# Patient Record
Sex: Female | Born: 1940 | Race: White | Hispanic: No | Smoking: Never smoker
Health system: Southern US, Community
[De-identification: ages and names within clinical notes are randomized; demographics above are authoritative.]

## PROBLEM LIST (undated history)

## (undated) DIAGNOSIS — C8590 Non-Hodgkin lymphoma, unspecified, unspecified site: Secondary | ICD-10-CM

## (undated) DIAGNOSIS — A0472 Enterocolitis due to Clostridium difficile, not specified as recurrent: Secondary | ICD-10-CM

## (undated) DIAGNOSIS — M3500 Sicca syndrome, unspecified: Secondary | ICD-10-CM

## (undated) DIAGNOSIS — F32A Depression, unspecified: Secondary | ICD-10-CM

## (undated) DIAGNOSIS — F329 Major depressive disorder, single episode, unspecified: Secondary | ICD-10-CM

## (undated) DIAGNOSIS — K219 Gastro-esophageal reflux disease without esophagitis: Secondary | ICD-10-CM

## (undated) DIAGNOSIS — M1990 Unspecified osteoarthritis, unspecified site: Secondary | ICD-10-CM

## (undated) DIAGNOSIS — I10 Essential (primary) hypertension: Secondary | ICD-10-CM

## (undated) DIAGNOSIS — N390 Urinary tract infection, site not specified: Secondary | ICD-10-CM

## (undated) HISTORY — PX: REPLACEMENT TOTAL KNEE BILATERAL: SUR1225

## (undated) HISTORY — PX: TOTAL ABDOMINAL HYSTERECTOMY: SHX209

## (undated) HISTORY — PX: FOOT SURGERY: SHX648

## (undated) HISTORY — PX: BREAST REDUCTION SURGERY: SHX8

## (undated) HISTORY — PX: CHOLECYSTECTOMY: SHX55

## (undated) HISTORY — PX: SHOULDER SURGERY: SHX246

## (undated) HISTORY — PX: TONSILLECTOMY: SUR1361

---

## 2018-01-15 DIAGNOSIS — A0472 Enterocolitis due to Clostridium difficile, not specified as recurrent: Secondary | ICD-10-CM

## 2018-01-15 DIAGNOSIS — N390 Urinary tract infection, site not specified: Secondary | ICD-10-CM

## 2018-01-15 HISTORY — DX: Enterocolitis due to Clostridium difficile, not specified as recurrent: A04.72

## 2018-01-15 HISTORY — DX: Urinary tract infection, site not specified: N39.0

## 2018-02-07 ENCOUNTER — Inpatient Hospital Stay (HOSPITAL_COMMUNITY)
Admission: EM | Admit: 2018-02-07 | Discharge: 2018-02-09 | DRG: 689 | Disposition: A | Discharge status: Other Acute Inpatient Hospital | Payer: Medicare Other | Attending: Internal Medicine | Admitting: Internal Medicine

## 2018-02-07 ENCOUNTER — Emergency Department (HOSPITAL_COMMUNITY): Payer: Medicare Other

## 2018-02-07 ENCOUNTER — Other Ambulatory Visit: Payer: Self-pay

## 2018-02-07 ENCOUNTER — Encounter (HOSPITAL_COMMUNITY): Payer: Self-pay | Admitting: Emergency Medicine

## 2018-02-07 DIAGNOSIS — Z882 Allergy status to sulfonamides status: Secondary | ICD-10-CM

## 2018-02-07 DIAGNOSIS — Z791 Long term (current) use of non-steroidal anti-inflammatories (NSAID): Secondary | ICD-10-CM | POA: Diagnosis not present

## 2018-02-07 DIAGNOSIS — Z96653 Presence of artificial knee joint, bilateral: Secondary | ICD-10-CM | POA: Diagnosis present

## 2018-02-07 DIAGNOSIS — D638 Anemia in other chronic diseases classified elsewhere: Secondary | ICD-10-CM | POA: Diagnosis present

## 2018-02-07 DIAGNOSIS — Z91048 Other nonmedicinal substance allergy status: Secondary | ICD-10-CM

## 2018-02-07 DIAGNOSIS — N179 Acute kidney failure, unspecified: Secondary | ICD-10-CM | POA: Diagnosis present

## 2018-02-07 DIAGNOSIS — Z9221 Personal history of antineoplastic chemotherapy: Secondary | ICD-10-CM

## 2018-02-07 DIAGNOSIS — D709 Neutropenia, unspecified: Secondary | ICD-10-CM | POA: Diagnosis present

## 2018-02-07 DIAGNOSIS — Z79899 Other long term (current) drug therapy: Secondary | ICD-10-CM | POA: Diagnosis not present

## 2018-02-07 DIAGNOSIS — Z1612 Extended spectrum beta lactamase (ESBL) resistance: Secondary | ICD-10-CM | POA: Diagnosis present

## 2018-02-07 DIAGNOSIS — B9620 Unspecified Escherichia coli [E. coli] as the cause of diseases classified elsewhere: Secondary | ICD-10-CM | POA: Diagnosis present

## 2018-02-07 DIAGNOSIS — I10 Essential (primary) hypertension: Secondary | ICD-10-CM | POA: Diagnosis present

## 2018-02-07 DIAGNOSIS — W19XXXA Unspecified fall, initial encounter: Secondary | ICD-10-CM | POA: Diagnosis not present

## 2018-02-07 DIAGNOSIS — D72819 Decreased white blood cell count, unspecified: Secondary | ICD-10-CM | POA: Diagnosis not present

## 2018-02-07 DIAGNOSIS — N390 Urinary tract infection, site not specified: Secondary | ICD-10-CM | POA: Diagnosis present

## 2018-02-07 DIAGNOSIS — Z885 Allergy status to narcotic agent status: Secondary | ICD-10-CM | POA: Diagnosis not present

## 2018-02-07 DIAGNOSIS — R739 Hyperglycemia, unspecified: Secondary | ICD-10-CM | POA: Diagnosis present

## 2018-02-07 DIAGNOSIS — R296 Repeated falls: Secondary | ICD-10-CM | POA: Diagnosis present

## 2018-02-07 DIAGNOSIS — Z6820 Body mass index (BMI) 20.0-20.9, adult: Secondary | ICD-10-CM | POA: Diagnosis not present

## 2018-02-07 DIAGNOSIS — E43 Unspecified severe protein-calorie malnutrition: Secondary | ICD-10-CM | POA: Diagnosis present

## 2018-02-07 DIAGNOSIS — A0472 Enterocolitis due to Clostridium difficile, not specified as recurrent: Secondary | ICD-10-CM | POA: Diagnosis present

## 2018-02-07 DIAGNOSIS — W010XXA Fall on same level from slipping, tripping and stumbling without subsequent striking against object, initial encounter: Secondary | ICD-10-CM | POA: Diagnosis present

## 2018-02-07 DIAGNOSIS — R5081 Fever presenting with conditions classified elsewhere: Secondary | ICD-10-CM | POA: Diagnosis not present

## 2018-02-07 DIAGNOSIS — C8330 Diffuse large B-cell lymphoma, unspecified site: Secondary | ICD-10-CM | POA: Diagnosis present

## 2018-02-07 DIAGNOSIS — Z888 Allergy status to other drugs, medicaments and biological substances status: Secondary | ICD-10-CM

## 2018-02-07 DIAGNOSIS — Z880 Allergy status to penicillin: Secondary | ICD-10-CM

## 2018-02-07 DIAGNOSIS — G9341 Metabolic encephalopathy: Secondary | ICD-10-CM | POA: Diagnosis present

## 2018-02-07 DIAGNOSIS — D649 Anemia, unspecified: Secondary | ICD-10-CM | POA: Diagnosis present

## 2018-02-07 DIAGNOSIS — B9629 Other Escherichia coli [E. coli] as the cause of diseases classified elsewhere: Secondary | ICD-10-CM | POA: Diagnosis present

## 2018-02-07 DIAGNOSIS — C8331 Diffuse large B-cell lymphoma, lymph nodes of head, face, and neck: Secondary | ICD-10-CM | POA: Diagnosis not present

## 2018-02-07 DIAGNOSIS — M3500 Sicca syndrome, unspecified: Secondary | ICD-10-CM | POA: Diagnosis present

## 2018-02-07 DIAGNOSIS — K219 Gastro-esophageal reflux disease without esophagitis: Secondary | ICD-10-CM | POA: Diagnosis present

## 2018-02-07 DIAGNOSIS — D703 Neutropenia due to infection: Secondary | ICD-10-CM | POA: Diagnosis not present

## 2018-02-07 HISTORY — DX: Major depressive disorder, single episode, unspecified: F32.9

## 2018-02-07 HISTORY — DX: Non-Hodgkin lymphoma, unspecified, unspecified site: C85.90

## 2018-02-07 HISTORY — DX: Essential (primary) hypertension: I10

## 2018-02-07 HISTORY — DX: Sjogren syndrome, unspecified: M35.00

## 2018-02-07 HISTORY — DX: Depression, unspecified: F32.A

## 2018-02-07 HISTORY — DX: Gastro-esophageal reflux disease without esophagitis: K21.9

## 2018-02-07 HISTORY — DX: Urinary tract infection, site not specified: N39.0

## 2018-02-07 HISTORY — DX: Unspecified osteoarthritis, unspecified site: M19.90

## 2018-02-07 HISTORY — DX: Enterocolitis due to Clostridium difficile, not specified as recurrent: A04.72

## 2018-02-07 LAB — CBC WITH DIFFERENTIAL/PLATELET
Basophils Absolute: 0 10*3/uL (ref 0.0–0.1)
Basophils Relative: 0 %
Eosinophils Absolute: 0 10*3/uL (ref 0.0–0.7)
Eosinophils Relative: 7 %
HCT: 27.6 % — ABNORMAL LOW (ref 36.0–46.0)
Hemoglobin: 8.7 g/dL — ABNORMAL LOW (ref 12.0–15.0)
Lymphocytes Relative: 25 %
Lymphs Abs: 0.2 10*3/uL — ABNORMAL LOW (ref 0.7–4.0)
MCH: 31.2 pg (ref 26.0–34.0)
MCHC: 31.5 g/dL (ref 30.0–36.0)
MCV: 98.9 fL (ref 78.0–100.0)
MONO ABS: 0.3 10*3/uL (ref 0.1–1.0)
Monocytes Relative: 41 %
NEUTROS PCT: 27 %
Neutro Abs: 0.2 10*3/uL — ABNORMAL LOW (ref 1.7–7.7)
Platelets: 230 10*3/uL (ref 150–400)
RBC: 2.79 MIL/uL — AB (ref 3.87–5.11)
RDW: 12.5 % (ref 11.5–15.5)
WBC: 0.7 10*3/uL — AB (ref 4.0–10.5)

## 2018-02-07 LAB — COMPREHENSIVE METABOLIC PANEL
ALT: 39 U/L (ref 14–54)
AST: 52 U/L — AB (ref 15–41)
Albumin: 2 g/dL — ABNORMAL LOW (ref 3.5–5.0)
Alkaline Phosphatase: 41 U/L (ref 38–126)
Anion gap: 10 (ref 5–15)
BUN: 45 mg/dL — AB (ref 6–20)
CHLORIDE: 105 mmol/L (ref 101–111)
CO2: 25 mmol/L (ref 22–32)
CREATININE: 1.79 mg/dL — AB (ref 0.44–1.00)
Calcium: 8.4 mg/dL — ABNORMAL LOW (ref 8.9–10.3)
GFR calc non Af Amer: 26 mL/min — ABNORMAL LOW (ref >60)
GFR, EST AFRICAN AMERICAN: 30 mL/min — AB (ref >60)
Glucose, Bld: 124 mg/dL — ABNORMAL HIGH (ref 65–99)
POTASSIUM: 4 mmol/L (ref 3.5–5.1)
Sodium: 140 mmol/L (ref 135–145)
Total Bilirubin: 0.7 mg/dL (ref 0.3–1.2)
Total Protein: 4.7 g/dL — ABNORMAL LOW (ref 6.5–8.1)

## 2018-02-07 LAB — URINALYSIS, ROUTINE W REFLEX MICROSCOPIC
Bilirubin Urine: NEGATIVE
GLUCOSE, UA: NEGATIVE mg/dL
Ketones, ur: NEGATIVE mg/dL
NITRITE: POSITIVE — AB
PROTEIN: 30 mg/dL — AB
SPECIFIC GRAVITY, URINE: 1.016 (ref 1.005–1.030)
pH: 5 (ref 5.0–8.0)

## 2018-02-07 MED ORDER — GABAPENTIN 400 MG PO CAPS
400.0000 mg | ORAL_CAPSULE | Freq: Every day | ORAL | Status: DC
  Administered 2018-02-07 – 2018-02-08 (×2): 400 mg via ORAL
  Filled 2018-02-07 (×2): qty 1

## 2018-02-07 MED ORDER — METOPROLOL TARTRATE 50 MG PO TABS
50.0000 mg | ORAL_TABLET | Freq: Two times a day (BID) | ORAL | Status: DC
  Administered 2018-02-07 – 2018-02-09 (×4): 50 mg via ORAL
  Filled 2018-02-07 (×4): qty 1

## 2018-02-07 MED ORDER — VANCOMYCIN HCL 125 MG PO CAPS
125.0000 mg | ORAL_CAPSULE | Freq: Three times a day (TID) | ORAL | Status: DC
  Filled 2018-02-07 (×2): qty 1

## 2018-02-07 MED ORDER — LATANOPROST 0.005 % OP SOLN
1.0000 [drp] | Freq: Every day | OPHTHALMIC | Status: DC
  Administered 2018-02-07 – 2018-02-08 (×2): 1 [drp] via OPHTHALMIC
  Filled 2018-02-07: qty 2.5

## 2018-02-07 MED ORDER — SODIUM CHLORIDE 0.9 % IV BOLUS
500.0000 mL | Freq: Once | INTRAVENOUS | Status: AC
  Administered 2018-02-07: 500 mL via INTRAVENOUS

## 2018-02-07 MED ORDER — ACETAMINOPHEN 650 MG RE SUPP: 650.0000 mg | Freq: Four times a day (QID) | RECTAL | Status: DC | PRN

## 2018-02-07 MED ORDER — LACTATED RINGERS IV SOLN
INTRAVENOUS | Status: DC
  Administered 2018-02-07 – 2018-02-09 (×4): via INTRAVENOUS

## 2018-02-07 MED ORDER — ENOXAPARIN SODIUM 30 MG/0.3ML ~~LOC~~ SOLN
30.0000 mg | SUBCUTANEOUS | Status: DC
  Administered 2018-02-07: 30 mg via SUBCUTANEOUS
  Filled 2018-02-07: qty 0.3

## 2018-02-07 MED ORDER — MIRABEGRON ER 50 MG PO TB24
50.0000 mg | ORAL_TABLET | Freq: Every day | ORAL | Status: DC
  Administered 2018-02-07 – 2018-02-08 (×2): 50 mg via ORAL
  Filled 2018-02-07 (×3): qty 1

## 2018-02-07 MED ORDER — TIMOLOL MALEATE 0.5 % OP SOLN
1.0000 [drp] | Freq: Every day | OPHTHALMIC | Status: DC
  Administered 2018-02-08 – 2018-02-09 (×2): 1 [drp] via OPHTHALMIC
  Filled 2018-02-07 (×3): qty 5

## 2018-02-07 MED ORDER — DOCUSATE SODIUM 100 MG PO CAPS
100.0000 mg | ORAL_CAPSULE | Freq: Two times a day (BID) | ORAL | Status: DC
  Administered 2018-02-07 – 2018-02-08 (×2): 100 mg via ORAL
  Filled 2018-02-07 (×4): qty 1

## 2018-02-07 MED ORDER — SIMVASTATIN 20 MG PO TABS
20.0000 mg | ORAL_TABLET | Freq: Every day | ORAL | Status: DC
  Administered 2018-02-07 – 2018-02-08 (×2): 20 mg via ORAL
  Filled 2018-02-07 (×2): qty 1

## 2018-02-07 MED ORDER — VANCOMYCIN 50 MG/ML ORAL SOLUTION
125.0000 mg | Freq: Four times a day (QID) | ORAL | Status: DC
  Administered 2018-02-07 – 2018-02-09 (×8): 125 mg via ORAL
  Filled 2018-02-07 (×11): qty 2.5

## 2018-02-07 MED ORDER — ONDANSETRON HCL 4 MG PO TABS: 4.0000 mg | ORAL_TABLET | Freq: Four times a day (QID) | ORAL | Status: DC | PRN

## 2018-02-07 MED ORDER — ONDANSETRON HCL 4 MG/2ML IJ SOLN: 4.0000 mg | Freq: Four times a day (QID) | INTRAMUSCULAR | Status: DC | PRN

## 2018-02-07 MED ORDER — ALLOPURINOL 100 MG PO TABS
100.0000 mg | ORAL_TABLET | Freq: Every day | ORAL | Status: DC
  Administered 2018-02-08 – 2018-02-09 (×2): 100 mg via ORAL
  Filled 2018-02-07 (×2): qty 1

## 2018-02-07 MED ORDER — ACETAMINOPHEN 325 MG PO TABS
650.0000 mg | ORAL_TABLET | Freq: Four times a day (QID) | ORAL | Status: DC | PRN
  Administered 2018-02-08 (×2): 650 mg via ORAL
  Filled 2018-02-07 (×2): qty 2

## 2018-02-07 MED ORDER — SODIUM CHLORIDE 0.9 % IV SOLN
1.0000 g | Freq: Once | INTRAVENOUS | Status: AC
  Administered 2018-02-07: 1 g via INTRAVENOUS
  Filled 2018-02-07: qty 10

## 2018-02-07 MED ORDER — SODIUM CHLORIDE 0.9 % IV SOLN
1.0000 g | Freq: Two times a day (BID) | INTRAVENOUS | Status: DC
  Administered 2018-02-07 – 2018-02-08 (×2): 1 g via INTRAVENOUS
  Filled 2018-02-07 (×4): qty 1

## 2018-02-07 MED ORDER — FAMOTIDINE 20 MG PO TABS
20.0000 mg | ORAL_TABLET | Freq: Every day | ORAL | Status: DC
  Administered 2018-02-07 – 2018-02-08 (×2): 20 mg via ORAL
  Filled 2018-02-07 (×2): qty 1

## 2018-02-07 MED ORDER — ENSURE ENLIVE PO LIQD
237.0000 mL | Freq: Two times a day (BID) | ORAL | Status: DC
  Administered 2018-02-08: 237 mL via ORAL

## 2018-02-07 NOTE — ED Notes (Signed)
Both sets of blood cultures drawn prior to initiating antibiotic.

## 2018-02-07 NOTE — ED Notes (Signed)
Attempted to call report unsuccessfully to [place] x 1.

## 2018-02-07 NOTE — Progress Notes (Signed)
Pharmacy Antibiotic Note  Erica Day is a 77 y.o. female admitted on 02/07/2018 with UTI.  Pharmacy has been consulted for meropenem dosing. Pt is afebrile and WBC is low. Scr is elevated well above baseline at 1.79.   Plan: Meropenem 1gm IV Q12H F/u renal fxn, C&S, clinical status   Height: 5\' 7"  (170.2 cm) Weight: 130 lb (59 kg) IBW/kg (Calculated) : 61.6  Temp (24hrs), Avg:98 F (36.7 C), Min:98 F (36.7 C), Max:98 F (36.7 C)  Recent Labs  Lab 02/07/18 1112  WBC 0.7*  CREATININE 1.79*    Estimated Creatinine Clearance: 23.7 mL/min (A) (by C-G formula based on SCr of 1.79 mg/dL (H)).    Allergies  Allergen Reactions  . Benylin Cough [Diphenhydramine] Anaphylaxis and Shortness Of Breath  . Codeine Nausea And Vomiting  . Tape Other (See Comments)    Pt states makes her skin burn  Transparent dressing causes a rash per patient - Please use mepilex  . Aloe Vera Rash    Turn to sores  . Penicillins Rash    Has patient had a PCN reaction causing immediate rash, facial/tongue/throat swelling, SOB or lightheadedness with hypotension: No Has patient had a PCN reaction causing severe rash involving mucus membranes or skin necrosis: No Has patient had a PCN reaction that required hospitalization: No Has patient had a PCN reaction occurring within the last 10 years: Yes If all of the above answers are "NO", then may proceed with Cephalosporin use.  . Sertraline Anxiety  . Sulfa Antibiotics Rash    Antimicrobials this admission: Meropenem 6/24>> CTX x 1 6/24  Dose adjustments this admission: N/A  Microbiology results: Pending  Thank you for allowing pharmacy to be a part of this patient's care.  Myer Bohlman, Rande Lawman 02/07/2018 3:15 PM

## 2018-02-07 NOTE — ED Notes (Signed)
Patient transported to CT 

## 2018-02-07 NOTE — ED Provider Notes (Signed)
[facility] EMERGENCY DEPARTMENT Provider Note   CSN: [id number] Arrival date & time: 02/07/18  0831     History   Chief Complaint Chief Complaint  Patient presents with  . Fall    HPI Erica Day is a 77 y.o. female.  HPI Patient lives alone and ambulates with walker.  Has had multiple falls over the last few days.  Unwitnessed.  Daughter at bedside states that she fell this weekend.  Does not believe that she hit her head or loss consciousness.  Is unsure why she fell.  Patient with history of B-cell lymphoma and recent hospitalizations for C. Difficile colitis.  Complains of mild right shoulder and right rib pain after fall.  Denying abdominal pain, nausea, vomiting or diarrhea. Past Medical History:  Diagnosis Date  . Lymphoma ([place])   . Sjogren's syndrome ([place])     There are no active problems to display for this patient.   Past Surgical History:  Procedure Laterality Date  . TONSILLECTOMY       OB History   None      Home Medications    Prior to Admission medications   Medication Sig Start Date End Date Taking? Authorizing Provider  allopurinol (ZYLOPRIM) 100 MG tablet Take 100 mg by mouth daily. 02/04/18  Yes [provider]  docusate sodium (COLACE) 100 MG capsule Take 100 mg by mouth as needed for mild constipation.   Yes [provider]  folic acid (FOLVITE) 1 MG tablet Take 1 mg by mouth daily. 04/20/17  Yes [provider]  gabapentin (NEURONTIN) 400 MG capsule Take 400 mg by mouth at bedtime. 12/02/17  Yes [provider]  lidocaine-prilocaine (EMLA) cream Apply 1 application topically as needed (port).    Yes [provider]  meloxicam (MOBIC) 15 MG tablet Take 15 mg by mouth daily. 01/26/18  Yes [provider]  metoprolol tartrate (LOPRESSOR) 50 MG tablet Take 50 mg by mouth 2 (two) times daily. 12/08/17  Yes [provider]  mirabegron ER (MYRBETRIQ) 50 MG TB24 tablet Take  50 mg by mouth at bedtime.   Yes [provider]  Multiple Vitamin (MULTIVITAMIN) capsule Take 1 capsule by mouth daily.   Yes [provider]  nystatin (MYCOSTATIN/NYSTOP) powder Apply 1 application topically 2 (two) times daily as needed (under breast and back).    Yes [provider]  ondansetron (ZOFRAN) 8 MG tablet Take 8 mg by mouth every 8 (eight) hours as needed for nausea or vomiting.  02/07/18  Yes [provider]  ranitidine (ZANTAC) 150 MG tablet Take 150 mg by mouth 2 (two) times daily. 12/01/17  Yes [provider]  simvastatin (ZOCOR) 20 MG tablet Take 20 mg by mouth at bedtime. 12/08/17  Yes [provider]  temazepam (RESTORIL) 30 MG capsule Take 30 mg by mouth at bedtime as needed for sleep.  12/01/17 12/01/18 Yes [provider]  timolol (BETIMOL) 0.5 % ophthalmic solution Place 1 drop into both eyes daily.   Yes [provider]  Travoprost, BAK Free, (TRAVATAN) 0.004 % SOLN ophthalmic solution Place 1 drop into both eyes at bedtime.   Yes [provider]  vancomycin (VANCOCIN) 125 MG capsule Take 125 mg by mouth 3 (three) times daily.  02/04/18  Yes [provider]  amLODipine-olmesartan (AZOR) 10-40 MG tablet Take 1 tablet by mouth daily. 02/04/18   [provider]  furosemide (LASIX) 20 MG tablet Take 20 mg by mouth. 12/24/17  [provider]  hydrochlorothiazide (HYDRODIURIL) 12.5 MG tablet Take 12.5 mg by mouth daily. 12/08/17   [provider]  potassium chloride SA (K-DUR,KLOR-CON) 20 MEQ tablet Take 20 mEq by mouth every other day.    [provider]    Family History No family history on file.  Social History Social History   Tobacco Use  . Smoking status: Never Smoker  . Smokeless tobacco: Never Used  Substance Use Topics  . Alcohol use: Never    Frequency: Never  . Drug use: Never     Allergies   Benylin cough [diphenhydramine]; Codeine;  Tape; Aloe vera; Penicillins; Sertraline; and Sulfa antibiotics   Review of Systems Review of Systems  Constitutional: Positive for fatigue. Negative for chills and fever.  Eyes: Negative for visual disturbance.  Respiratory: Negative for cough and shortness of breath.   Cardiovascular: Negative for chest pain and palpitations.  Gastrointestinal: Negative for abdominal pain, blood in stool, constipation, diarrhea, nausea and vomiting.  Genitourinary: Negative for dysuria, flank pain and frequency.  Musculoskeletal: Positive for arthralgias, back pain and gait problem. Negative for neck pain.  Skin: Negative for rash and wound.  Neurological: Positive for weakness. Negative for dizziness, light-headedness, numbness and headaches.  All other systems reviewed and are negative.    Physical Exam Updated Vital Signs BP (!) 133/52   Pulse 100   Temp 98 F (36.7 C) (Oral)   Resp (!) 21   Ht 5\' 7"  (1.702 m)   Wt 59 kg (130 lb)   SpO2 97%   BMI 20.36 kg/m   Physical Exam  Constitutional: She is oriented to person, place, and time. She appears well-developed and well-nourished. No distress.  HENT:  Head: Normocephalic and atraumatic.  Mouth/Throat: Oropharynx is clear and moist.  No scalp trauma.  No intraoral trauma.  Midface is stable.  Cranial nerves II through XII grossly intact.  Eyes: Pupils are equal, round, and reactive to light. EOM are normal.  Neck: Normal range of motion. Neck supple.  No posterior midline cervical tenderness to palpation.  Cardiovascular: Normal rate and regular rhythm. Exam reveals no gallop and no friction rub.  No murmur heard. Pulmonary/Chest: Effort normal and breath sounds normal. No stridor. No respiratory distress. She has no wheezes. She has no rales. She exhibits no tenderness.  Abdominal: Soft. Bowel sounds are normal. There is no tenderness. There is no rebound and no guarding.  Musculoskeletal: Normal range of motion. She exhibits no edema  or tenderness.  Mild right thoracic tenderness to palpation.  No obvious injury.  Full range of motion of joints including the right shoulder without obvious discomfort, deformity or swelling.  Distal pulses intact.  No lower extremity swelling, patient has 2+ patient has 2+ bilateral lower extremity swelling.   Neurological: She is alert and oriented to person, place, and time.  5/5 motor in all extremities.  Sensation fully intact.  Skin: Skin is warm and dry. No rash noted. She is not diaphoretic. No erythema. There is pallor.  Psychiatric: She has a normal mood and affect. Her behavior is normal.  Nursing note and vitals reviewed.    ED Treatments / Results  Labs (all labs ordered are listed, but only abnormal results are displayed) Labs Reviewed  COMPREHENSIVE METABOLIC PANEL - Abnormal; Notable for the following components:      Result Value   Glucose, Bld 124 (*)    BUN 45 (*)    Creatinine, Ser 1.79 (*)    Calcium 8.4 (*)  Total Protein 4.7 (*)    Albumin 2.0 (*)    AST 52 (*)    GFR calc non Af Amer 26 (*)    GFR calc Af Amer 30 (*)    All other components within normal limits  CBC WITH DIFFERENTIAL/PLATELET - Abnormal; Notable for the following components:   WBC 0.7 (*)    RBC 2.79 (*)    Hemoglobin 8.7 (*)    HCT 27.6 (*)    Neutro Abs 0.2 (*)    Lymphs Abs 0.2 (*)    All other components within normal limits  URINALYSIS, ROUTINE W REFLEX MICROSCOPIC - Abnormal; Notable for the following components:   Color, Urine AMBER (*)    APPearance CLOUDY (*)    Hgb urine dipstick SMALL (*)    Protein, ur 30 (*)    Nitrite POSITIVE (*)    Leukocytes, UA TRACE (*)    Bacteria, UA MANY (*)    Non Squamous Epithelial 0-5 (*)    All other components within normal limits  URINE CULTURE  CULTURE, BLOOD (ROUTINE X 2)  CULTURE, BLOOD (ROUTINE X 2)    EKG EKG Interpretation  Date/Time:  Monday February 07 2018 09:47:49 EDT Ventricular Rate:  96 PR Interval:    QRS  Duration: 81 QT Interval:  339 QTC Calculation: 429 R Axis:   7 Text Interpretation:  Sinus rhythm Abnormal R-wave progression, early transition Confirmed by Julianne Rice [phone]) on 02/07/2018 2:18:35 PM   Radiology Dg Ribs Unilateral W/chest Right  Result Date: 02/07/2018 CLINICAL DATA:  Acute chest and RIGHT rib pain following fall. Initial encounter. EXAM: RIGHT RIBS AND CHEST - 3+ VIEW COMPARISON:  None. FINDINGS: The cardiomediastinal silhouette is unremarkable. There is no evidence of focal airspace disease, pulmonary edema, suspicious pulmonary nodule/mass, pleural effusion, or pneumothorax. A RIGHT IJ Port-A-Cath is noted with tip overlying the SUPERIOR cavoatrial junction. No acute bony abnormalities are identified. No acute fractures noted. RIGHT shoulder arthroplasty changes present. A apex RIGHT thoracic scoliosis is identified. IMPRESSION: 1. No acute abnormality or evidence of active cardiopulmonary disease. Electronically Signed   By: Margarette Canada M.D.   On: 02/07/2018 11:04   Ct Head Wo Contrast  Result Date: 02/07/2018 CLINICAL DATA:  Lost balance and fell. No loss of consciousness. Generalized headache. Initial encounter. EXAM: CT HEAD WITHOUT CONTRAST TECHNIQUE: Contiguous axial images were obtained from the base of the skull through the vertex without intravenous contrast. COMPARISON:  None. FINDINGS: Brain: There is no evidence of acute infarct, intracranial hemorrhage, midline shift, or extra-axial fluid collection. The ventricles and sulci are within normal limits for age. Periventricular white matter hypodensities are nonspecific but compatible with mild chronic small vessel ischemic disease. There is a 2.2 x 1.1 cm partially calcified extra-axial mass in the right lateral aspect of the anterior cranial fossa superior to the orbit with associated hyperostosis of the frontal bone and no evidence of significant brain edema. Vascular: Calcified atherosclerosis at the skull base.  No hyperdense vessel. Skull: No fracture. Sinuses/Orbits: Visualized paranasal sinuses and mastoid air cells are clear. Bilateral cataract extraction is noted. Other: None. IMPRESSION: 1. No evidence of acute intracranial abnormality. 2. Mild chronic small vessel ischemic disease. 3. 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 No edema. Electronically Signed   By: Logan Bores M.D.   On: 02/07/2018 10:40    Procedures Procedures (including critical care time)  Medications Ordered in ED Medications  cefTRIAXone (ROCEPHIN) 1 g in sodium chloride 0.9 %  100 mL IVPB (1 g Intravenous New Bag/Given 02/07/18 1329)  sodium chloride 0.9 % bolus 500 mL (0 mLs Intravenous Stopped 02/07/18 1329)     Initial Impression / Assessment and Plan / ED Course  I have reviewed the triage vital signs and the nursing notes.  Pertinent labs & imaging results that were available during my care of the patient were reviewed by me and considered in my medical decision making (see chart for details).    Discussed with Dr Cassell Clement.  Recommends treating UTI and trending white blood cell count.  If continues to have leukopenia may need to be transferred to [facility] for further work-up.  Urine culture and blood cultures were sent.  Patient was started on IV Rocephin.  Also started IV fluids.  Discussed with hospitalist, Dr. Lorin Mercy.  Will see patient in the emergency department and admit.   Final Clinical Impressions(s) / ED Diagnoses   Final diagnoses:  Fall, initial encounter  Acute lower UTI  Leukopenia, unspecified type  AKI (acute kidney injury) [facility])    ED Discharge Orders    None       Julianne Rice, MD 02/07/18 1427

## 2018-02-07 NOTE — ED Notes (Signed)
Patient transported to X-ray 

## 2018-02-07 NOTE — H&P (Signed)
History and Physical    Erica Day YYT:[id number] DOB: June 12, 1941 DOA: 02/07/2018  PCP: [facility] Consultants:  Cassell Clement - Oncology at [facility]; Rad Onc at [facility]; [facility] - rheumatology; Laurance Flatten - ENT Patient coming from:  [facility] - lives alone; [facility] Daughter, [phone]  Chief Complaint: fall  HPI: Erica Day is a 77 y.o. female with medical history significant of lymphoma, C diff colitis, and Sjogren's syndrome presenting with a fall.  She completed 6 sessions of chemo from Sept to Jan.  PET scan was clear.  Her lymphoma had been in her nose but it cleared after the first treatment.  1-2 months after finishing chemo, "that came back with a vengeance."  She saw ENT and oncology, and then was referred to rad onc.  She was supposed to get measured today.  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  She had been on Clindamycin, stopped it, had the biopsy, and then the ENT started her on Azithromycin.  When she finished it, she still had fevers and so she resumed Clinda.  She developed diarrhea that was very refractory.  She was admitted at [facility] for C diff colitis, discharged last Firiday.  She is still on oral vanc.  She still had fevers at the time of d/c up to 100.9.  They checked with oncology and they thought it was okay to d/c because the fever might be related to the lymphoma.  She kept having diarrhea and was getting worse, not making sense, feeling funny in the head, feeling very weak.  She has had ongoing diarrhea.  She fell on Sunday and was down about 6 hours.  She fell again today, and her daughter found her on the floor about 45 minutes later.  The phone was in her pocket and she couldn't figure out how to answer it.   She does not normally have urinary issues.  She is continuing to have fevers at least once a day.   ED Course:  H/o lymphoma, followed at [facility].  Started on Clinda - diagnosed on C diff, treated with PO Vanc.  Multiple falls  recently.  No abdominal pain.  Baseline creatinine 0.7, currently 1.79.  UA abnormal, concerning for infection.  Blood cultures pending.  Giving IVF and Rocephin.   Dr. Lita Mains called and spoke with [facility] - she recommends treating the infection, trending WBC count; if not improving, she may need a further work-up and possible transfer to [facility].  Review of Systems: As per HPI; otherwise review of systems reviewed and negative.   Ambulatory Status:  Ambulates with a walker  Past Medical History:  Diagnosis Date  . Arthritis   . C. difficile colitis 01/2018  . Depression   . GERD (gastroesophageal reflux disease)   . Hypertension   . Lymphoma ([place])   . Sjogren's syndrome ([place])   . UTI (urinary tract infection) 01/2018    Past Surgical History:  Procedure Laterality Date  . BREAST REDUCTION SURGERY    . CHOLECYSTECTOMY    . FOOT SURGERY    . REPLACEMENT TOTAL KNEE BILATERAL    . SHOULDER SURGERY    . TONSILLECTOMY    . TOTAL ABDOMINAL HYSTERECTOMY      Social History   Socioeconomic History  . Marital status: Unknown    Spouse name: Not on file  . Number of children: Not on file  . Years of education: Not on file  . Highest education level:  Not on file  Occupational History  . Occupation: retired  Scientific laboratory technician  . Financial resource strain: Not on file  . Food insecurity:    Worry: Not on file    Inability: Not on file  . Transportation needs:    Medical: Not on file    Non-medical: Not on file  Tobacco Use  . Smoking status: Never Smoker  . Smokeless tobacco: Never Used  Substance and Sexual Activity  . Alcohol use: Never    Frequency: Never  . Drug use: Never  . Sexual activity: Not on file  Lifestyle  . Physical activity:    Days per week: Not on file    Minutes per session: Not on file  . Stress: Not on file  Relationships  . Social connections:    Talks on phone: Not on file    Gets together: Not on file    Attends religious service: Not on file      Active member of club or organization: Not on file    Attends meetings of clubs or organizations: Not on file    Relationship status: Not on file  . Intimate partner violence:    Fear of current or ex partner: Not on file    Emotionally abused: Not on file    Physically abused: Not on file    Forced sexual activity: Not on file  Other Topics Concern  . Not on file  Social History Narrative  . Not on file    Allergies  Allergen Reactions  . Benylin Cough [Diphenhydramine] Anaphylaxis and Shortness Of Breath  . Codeine Nausea And Vomiting  . Tape Other (See Comments)    Pt states makes her skin burn  Transparent dressing causes a rash per patient - Please use mepilex  . Aloe Vera Rash    Turn to sores  . Penicillins Rash    Has patient had a PCN reaction causing immediate rash, facial/tongue/throat swelling, SOB or lightheadedness with hypotension: No Has patient had a PCN reaction causing severe rash involving mucus membranes or skin necrosis: No Has patient had a PCN reaction that required hospitalization: No Has patient had a PCN reaction occurring within the last 10 years: Yes If all of the above answers are "NO", then may proceed with Cephalosporin use.  . Sertraline Anxiety  . Sulfa Antibiotics Rash    History reviewed. No pertinent family history.  Prior to Admission medications   Medication Sig Start Date End Date Taking? Authorizing Provider  allopurinol (ZYLOPRIM) 100 MG tablet Take 100 mg by mouth daily. 02/04/18  Yes [provider]  docusate sodium (COLACE) 100 MG capsule Take 100 mg by mouth as needed for mild constipation.   Yes [provider]  folic acid (FOLVITE) 1 MG tablet Take 1 mg by mouth daily. 04/20/17  Yes [provider]  gabapentin (NEURONTIN) 400 MG capsule Take 400 mg by mouth at bedtime. 12/02/17  Yes [provider]  lidocaine-prilocaine (EMLA) cream Apply 1 application topically as needed (port).    Yes  [provider]  meloxicam (MOBIC) 15 MG tablet Take 15 mg by mouth daily. 01/26/18  Yes [provider]  metoprolol tartrate (LOPRESSOR) 50 MG tablet Take 50 mg by mouth 2 (two) times daily. 12/08/17  Yes [provider]  mirabegron ER (MYRBETRIQ) 50 MG TB24 tablet Take 50 mg by mouth at bedtime.   Yes [provider]  Multiple Vitamin (MULTIVITAMIN) capsule Take 1 capsule by mouth daily.   Yes  [provider]  nystatin (MYCOSTATIN/NYSTOP) powder Apply 1 application topically 2 (two) times daily as needed (under breast and back).    Yes [provider]  ondansetron (ZOFRAN) 8 MG tablet Take 8 mg by mouth every 8 (eight) hours as needed for nausea or vomiting.  02/07/18  Yes [provider]  ranitidine (ZANTAC) 150 MG tablet Take 150 mg by mouth 2 (two) times daily. 12/01/17  Yes [provider]  simvastatin (ZOCOR) 20 MG tablet Take 20 mg by mouth at bedtime. 12/08/17  Yes [provider]  temazepam (RESTORIL) 30 MG capsule Take 30 mg by mouth at bedtime as needed for sleep.  12/01/17 12/01/18 Yes [provider]  timolol (BETIMOL) 0.5 % ophthalmic solution Place 1 drop into both eyes daily.   Yes [provider]  Travoprost, BAK Free, (TRAVATAN) 0.004 % SOLN ophthalmic solution Place 1 drop into both eyes at bedtime.   Yes [provider]  vancomycin (VANCOCIN) 125 MG capsule Take 125 mg by mouth 3 (three) times daily.  02/04/18  Yes [provider]  amLODipine-olmesartan (AZOR) 10-40 MG tablet Take 1 tablet by mouth daily. 02/04/18   [provider]  furosemide (LASIX) 20 MG tablet Take 20 mg by mouth. 12/24/17   [provider]  hydrochlorothiazide (HYDRODIURIL) 12.5 MG tablet Take 12.5 mg by mouth daily. 12/08/17   [provider]  potassium chloride SA (K-DUR,KLOR-CON) 20 MEQ tablet Take 20 mEq by mouth every other day.    [provider]    Physical  Exam: Vitals:   02/07/18 1400 02/07/18 1430 02/07/18 1530 02/07/18 1645  BP: (!) 133/52 (!) 134/57 (!) 115/53 (!) 120/56  Pulse: 100 91 93 95  Resp: (!) 21 17 19    Temp:    100.3 F (37.9 C)  TempSrc:    Oral  SpO2: 97% 99% 99%   Weight:      Height:         General:  Appears calm and comfortable and is NAD Eyes:  PERRL, EOMI, normal lids, iris ENT:  grossly normal hearing, lips & tongue, mmm Neck:  no LAD, masses or thyromegaly; no carotid bruits Cardiovascular:  RRR, no m/r/g. No LE edema.  Respiratory:   CTA bilaterally with no wheezes/rales/rhonchi.  Normal respiratory effort. Abdomen:  soft, NT, ND, NABS Skin:  no rash or induration seen on limited exam Musculoskeletal:  grossly normal tone BUE/BLE, good ROM, no bony abnormality Psychiatric:  grossly normal mood and affect, speech fluent and appropriate, AOx3 Neurologic:  CN 2-12 grossly intact, moves all extremities in coordinated fashion, sensation intact    Radiological Exams on Admission: Dg Ribs Unilateral W/chest Right  Result Date: 02/07/2018 CLINICAL DATA:  Acute chest and RIGHT rib pain following fall. Initial encounter. EXAM: RIGHT RIBS AND CHEST - 3+ VIEW COMPARISON:  None. FINDINGS: The cardiomediastinal silhouette is unremarkable. There is no evidence of focal airspace disease, pulmonary edema, suspicious pulmonary nodule/mass, pleural effusion, or pneumothorax. A RIGHT IJ Port-A-Cath is noted with tip overlying the SUPERIOR cavoatrial junction. No acute bony abnormalities are identified. No acute fractures noted. RIGHT shoulder arthroplasty changes present. A apex RIGHT thoracic scoliosis is identified. IMPRESSION: 1. No acute abnormality or evidence of active cardiopulmonary disease. Electronically Signed   By: Margarette Canada M.D.   On: 02/07/2018 11:04   Ct Head Wo Contrast  Result Date: 02/07/2018 CLINICAL DATA:  Lost balance and fell. No loss of consciousness. Generalized headache. Initial encounter. EXAM: CT  HEAD WITHOUT  CONTRAST TECHNIQUE: Contiguous axial images were obtained from the base of the skull through the vertex without intravenous contrast. COMPARISON:  None. FINDINGS: Brain: There is no evidence of acute infarct, intracranial hemorrhage, midline shift, or extra-axial fluid collection. The ventricles and sulci are within normal limits for age. Periventricular white matter hypodensities are nonspecific but compatible with mild chronic small vessel ischemic disease. There is a 2.2 x 1.1 cm partially calcified extra-axial mass in the right lateral aspect of the anterior cranial fossa superior to the orbit with associated hyperostosis of the frontal bone and no evidence of significant brain edema. Vascular: Calcified atherosclerosis at the skull base. No hyperdense vessel. Skull: No fracture. Sinuses/Orbits: Visualized paranasal sinuses and mastoid air cells are clear. Bilateral cataract extraction is noted. Other: None. IMPRESSION: 1. No evidence of acute intracranial abnormality. 2. Mild chronic small vessel ischemic disease. 3. 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 No edema. Electronically Signed   By: Logan Bores M.D.   On: 02/07/2018 10:40    EKG: Independently reviewed.  NSR with rate 96; nonspecific ST changes with no evidence of acute ischemia   Labs on Admission: I have personally reviewed the available labs and imaging studies at the time of the admission.  Pertinent labs:   UA: cloudy; small Hgb; trace LE; positive nitrites; many bacteria; 21-50 WBC Urine culture from 6/20 at [facility]: 10-50,000 WBC, E. Coli, ESBL Glucose 124 BUN 45/Creatinine 1.79/GFR 30; 19/0.69/>90 on 6/21 Albumin 2.0 WBC 0.7; 1.4 on 6/21 Hgb 8.7   Assessment/Plan Principal Problem:   UTI due to extended-spectrum beta lactamase (ESBL) producing Escherichia coli Active Problems:   DLBCL (diffuse large B cell lymphoma) (HCC)   Sjogren's syndrome (HCC)   Acute renal failure (ARF)  (HCC)   Severe protein-calorie malnutrition (HCC)   Leukopenia   Hyperglycemia   Anemia   C. difficile colitis   ESBL UTI -Patient with prior hospitalization at [facility] -Urine culture from 6/20 has grown 10-50000 E coli with ESBL -Patient with metabolic encephalopathy, likely related to UTI -Will treat with Meropenem - some of the sensitivities are incorrect for an ESBL organism but carbapenems are considered effective treatment for this issue  C diff colitis -Patient was diagnosed while at [facility] -She was started on PO Vancomycin and is scheduled to end on 6/28 -Continue PO Vanc for now -No further C diff testing at this time  Acute renal failure -Creatinine 1.79, up from 0.69 on 6/21 -Likely prerenal azotemia in the setting of acute infection combined with ongoing GI losses -Rehydrate with LR and follow -Continue to hold antihypertensives  Lymphoma -Nasal sinus disease -She was diagnosed in [place] and has completed 6 cycles of RCHIP -She has been recommended to have targeted radiotherapy -Plan was to start treatment planning today; this will have to be rescheduled. -If patient's leukopenia does not resolve with treatment of UTI here in the hospital, consider transfer to [facility] for treatment of lymphoma  Leukopenia -Likely related to infection -Treat with IV antibiotics and follow -If this issue does not resolve with antibiotics, she may need consideration of transfer to [place] note, she has continued to have daily fevers since prior hospitalization  Hyperglycemia -May be stress response -Will follow with fasting AM labs and check A1c -It is unlikely that he will need acute or chronic treatment for this issue at this time  Anemia -Baseline Hgb appears to be about 10 -Hgb today is 8.7 and is normocytic -Will follow with daily CBC  DVT  prophylaxis: Lovenox Code Status:  Full - confirmed with patient/family Family Communication: Daughter present throughout evaluation   Disposition Plan:  Home once clinically improved Consults called: None  Admission status: Admit - It is my clinical opinion that admission to INPATIENT is reasonable and necessary because of the expectation that this patient will require hospital care that crosses at least 2 midnights to treat this condition based on the medical complexity of the problems presented.  Given the aforementioned information, the predictability of an adverse outcome is felt to be significant.    Karmen Bongo MD [facility]  If note is complete, please contact covering daytime or nighttime physician. [email or web address] Password [facility]  02/07/2018, 5:40 PM

## 2018-02-07 NOTE — ED Triage Notes (Signed)
Patient arrived to ED via [facility] from home. Lives alone. EMS reports:  Patient uses walker to assist with ambulation. Lost balance and fell. No LOC. Patient c/o R shoulder pain and R flank pain. PMH - Lymphoma, Sjognes syndrome. 20 gauge L AC. Port in R upper chest. BP 106/62, Pulse 90, 97% on room air.

## 2018-02-08 DIAGNOSIS — Z888 Allergy status to other drugs, medicaments and biological substances status: Secondary | ICD-10-CM

## 2018-02-08 DIAGNOSIS — A0472 Enterocolitis due to Clostridium difficile, not specified as recurrent: Secondary | ICD-10-CM

## 2018-02-08 DIAGNOSIS — Z880 Allergy status to penicillin: Secondary | ICD-10-CM

## 2018-02-08 DIAGNOSIS — R5081 Fever presenting with conditions classified elsewhere: Secondary | ICD-10-CM

## 2018-02-08 DIAGNOSIS — Z882 Allergy status to sulfonamides status: Secondary | ICD-10-CM

## 2018-02-08 DIAGNOSIS — C8331 Diffuse large B-cell lymphoma, lymph nodes of head, face, and neck: Secondary | ICD-10-CM

## 2018-02-08 DIAGNOSIS — W19XXXA Unspecified fall, initial encounter: Secondary | ICD-10-CM

## 2018-02-08 DIAGNOSIS — D72819 Decreased white blood cell count, unspecified: Secondary | ICD-10-CM

## 2018-02-08 DIAGNOSIS — N390 Urinary tract infection, site not specified: Principal | ICD-10-CM

## 2018-02-08 DIAGNOSIS — Z1612 Extended spectrum beta lactamase (ESBL) resistance: Secondary | ICD-10-CM

## 2018-02-08 DIAGNOSIS — D709 Neutropenia, unspecified: Secondary | ICD-10-CM

## 2018-02-08 DIAGNOSIS — Z885 Allergy status to narcotic agent status: Secondary | ICD-10-CM

## 2018-02-08 DIAGNOSIS — D703 Neutropenia due to infection: Secondary | ICD-10-CM

## 2018-02-08 DIAGNOSIS — B9629 Other Escherichia coli [E. coli] as the cause of diseases classified elsewhere: Secondary | ICD-10-CM

## 2018-02-08 DIAGNOSIS — N179 Acute kidney failure, unspecified: Secondary | ICD-10-CM

## 2018-02-08 LAB — BASIC METABOLIC PANEL
Anion gap: 13 (ref 5–15)
BUN: 35 mg/dL — ABNORMAL HIGH (ref 8–23)
CHLORIDE: 104 mmol/L (ref 98–111)
CO2: 26 mmol/L (ref 22–32)
Calcium: 8.1 mg/dL — ABNORMAL LOW (ref 8.9–10.3)
Creatinine, Ser: 1.1 mg/dL — ABNORMAL HIGH (ref 0.44–1.00)
GFR calc non Af Amer: 46 mL/min — ABNORMAL LOW (ref >60)
GFR, EST AFRICAN AMERICAN: 54 mL/min — AB (ref >60)
Glucose, Bld: 114 mg/dL — ABNORMAL HIGH (ref 70–99)
POTASSIUM: 4.2 mmol/L (ref 3.5–5.1)
SODIUM: 143 mmol/L (ref 135–145)

## 2018-02-08 LAB — DIFFERENTIAL
BAND NEUTROPHILS: 0 %
BLASTS: 0 %
Basophils Absolute: 0 10*3/uL (ref 0.0–0.1)
Basophils Relative: 0 %
EOS PCT: 4 %
Eosinophils Absolute: 0 10*3/uL (ref 0.0–0.7)
Lymphocytes Relative: 34 %
Lymphs Abs: 0.3 10*3/uL — ABNORMAL LOW (ref 0.7–4.0)
METAMYELOCYTES PCT: 0 %
Monocytes Absolute: 0.4 10*3/uL (ref 0.1–1.0)
Monocytes Relative: 35 %
Myelocytes: 0 %
NEUTROS ABS: 0.3 10*3/uL — AB (ref 1.7–7.7)
Neutrophils Relative %: 27 %
OTHER: 0 %
Promyelocytes Relative: 0 %
nRBC: 0 /100 WBC

## 2018-02-08 LAB — CBC
HEMATOCRIT: 31.3 % — AB (ref 36.0–46.0)
Hemoglobin: 9.4 g/dL — ABNORMAL LOW (ref 12.0–15.0)
MCH: 31.1 pg (ref 26.0–34.0)
MCHC: 30 g/dL (ref 30.0–36.0)
MCV: 103.6 fL — AB (ref 78.0–100.0)
Platelets: 238 10*3/uL (ref 150–400)
RBC: 3.02 MIL/uL — AB (ref 3.87–5.11)
RDW: 12.6 % (ref 11.5–15.5)
WBC: 1 10*3/uL — CL (ref 4.0–10.5)

## 2018-02-08 LAB — HEMOGLOBIN A1C
Hgb A1c MFr Bld: 5.7 % — ABNORMAL HIGH (ref 4.8–5.6)
Mean Plasma Glucose: 116.89 mg/dL

## 2018-02-08 LAB — PATHOLOGIST SMEAR REVIEW

## 2018-02-08 MED ORDER — TBO-FILGRASTIM 480 MCG/0.8ML ~~LOC~~ SOSY: 480.0000 ug | PREFILLED_SYRINGE | Freq: Every day | SUBCUTANEOUS | Status: DC

## 2018-02-08 MED ORDER — TBO-FILGRASTIM 480 MCG/0.8ML ~~LOC~~ SOSY
480.0000 ug | PREFILLED_SYRINGE | Freq: Every day | SUBCUTANEOUS | Status: DC
  Administered 2018-02-08: 480 ug via SUBCUTANEOUS
  Filled 2018-02-08 (×3): qty 0.8

## 2018-02-08 MED ORDER — ENOXAPARIN SODIUM 40 MG/0.4ML ~~LOC~~ SOLN
40.0000 mg | SUBCUTANEOUS | Status: DC
  Administered 2018-02-08: 40 mg via SUBCUTANEOUS
  Filled 2018-02-08: qty 0.4

## 2018-02-08 NOTE — Consult Note (Signed)
[facility] for Infectious Disease  Total days of antibiotics 2        Day 2 oral vanco        Day 1 meropenem       Reason for Consult: neutropenia and esbl uti   Referring Physician: elgergawy  Principal Problem:   UTI due to extended-spectrum beta lactamase (ESBL) producing Escherichia coli Active Problems:   DLBCL (diffuse large B cell lymphoma) (HCC)   Sjogren's syndrome (HCC)   Acute renal failure (ARF) (HCC)   Severe protein-calorie malnutrition (HCC)   Leukopenia   Hyperglycemia   Anemia   C. difficile colitis    HPI: Erica Day is a 77 y.o. female with history of diffuse bcell lymphoma s/p chemo from sep-jan 2019 with recurrence of head/neck lesion (2.2 x 1.1 cm partially calcified extra-axial mass in the right lateral aspect of the anterior cranial fossa superior to the orbit)- to be evaluated by rad onc, sjorgen syndrome recently discharged from [facility] on 6/21 with recent dx of clostridium difficile (dx of 01/30/18) and started on po vanco to treat through 6/28. Interestingly during her stay, she was also having fevers that were attributed to lymphoma and her WBC was 1.4 on discharge,steadily declining during her admission  While at home, She has has had episodes of falls x 2 days. On day of admit, her daughter found her on the floor about 45 minutes later. Patient denies any dizziness or lightheadedness but she does recall seeing a flash of light before falling to the ground. Unclear if she loses consciousness. She denies dysuria but noticing having smear of stool with each time she goes to urinate. In the ED, due to concern for abn UA and concern for UTI, she was given  Giving IVF and Rocephin then changed to meropenem since records show she is colonized with esbl ecoli  Patient appears non-toxic, chatty while laying in low bed. Denies ongoing diarrhea but has frequent small BMs  Past Medical History:  Diagnosis Date  . Arthritis   . C. difficile colitis 01/2018    . Depression   . GERD (gastroesophageal reflux disease)   . Hypertension   . Lymphoma ([place])   . Sjogren's syndrome ([place])   . UTI (urinary tract infection) 01/2018    Allergies:  Allergies  Allergen Reactions  . Benylin Cough [Diphenhydramine] Anaphylaxis and Shortness Of Breath  . Codeine Nausea And Vomiting  . Tape Other (See Comments)    Pt states makes her skin burn  Transparent dressing causes a rash per patient - Please use mepilex  . Aloe Vera Rash    Turn to sores  . Penicillins Rash    Has patient had a PCN reaction causing immediate rash, facial/tongue/throat swelling, SOB or lightheadedness with hypotension: No Has patient had a PCN reaction causing severe rash involving mucus membranes or skin necrosis: No Has patient had a PCN reaction that required hospitalization: No Has patient had a PCN reaction occurring within the last 10 years: Yes If all of the above answers are "NO", then may proceed with Cephalosporin use.  . Sertraline Anxiety  . Sulfa Antibiotics Rash     MEDICATIONS: . allopurinol  100 mg Oral Daily  . docusate sodium  100 mg Oral BID  . enoxaparin (LOVENOX) injection  40 mg Subcutaneous Q24H  . famotidine  20 mg Oral QHS  . gabapentin  400 mg Oral QHS  . latanoprost  1 drop Both Eyes QHS  . metoprolol tartrate  50 mg Oral BID  . mirabegron ER  50 mg Oral QHS  . simvastatin  20 mg Oral QHS  . timolol  1 drop Both Eyes Daily  . vancomycin  125 mg Oral QID    Social History   Tobacco Use  . Smoking status: Never Smoker  . Smokeless tobacco: Never Used  Substance Use Topics  . Alcohol use: Never    Frequency: Never  . Drug use: Never    History reviewed. No pertinent family history.  Review of Systems   Constitutional: positive for fever,and negative chills, diaphoresis, activity change, appetite change, fatigue and unexpected weight change.  HENT: Negative for congestion, sore throat, rhinorrhea, sneezing, trouble swallowing and  sinus pressure.  Eyes: Negative for photophobia and visual disturbance.  Respiratory: Negative for cough, chest tightness, shortness of breath, wheezing and stridor.  Cardiovascular: Negative for chest pain, palpitations and leg swelling.  Gastrointestinal: Negative for nausea, vomiting, abdominal pain, diarrhea, constipation, blood in stool, abdominal distention and anal bleeding.  Genitourinary: Negative for dysuria, hematuria, flank pain and difficulty urinating.  Musculoskeletal: Negative for myalgias, back pain, joint swelling, arthralgias and gait problem.  Skin: Negative for color change, pallor, rash and wound.  Neurological: + falls Hematological: Negative for adenopathy. Does not bruise/bleed easily.  Psychiatric/Behavioral: Negative for behavioral problems, confusion, sleep disturbance, dysphoric mood, decreased concentration and agitation.      OBJECTIVE: Temp:  [98.2 F (36.8 C)-100.3 F (37.9 C)] 98.2 F (36.8 C) (06/25 0444) Pulse Rate:  [82-95] 82 (06/25 0444) Resp:  [16-19] 16 (06/25 0444) BP: (115-134)/(53-62) 128/62 (06/25 0444) SpO2:  [94 %-99 %] 94 % (06/25 0444) Physical Exam  Constitutional:  oriented to person, place, and time. appears well-developed and well-nourished. No distress.  HENT: [place]/AT, PERRLA, no scleral icterus Mouth/Throat: Oropharynx is clear and moist. No oropharyngeal exudate.  Cardiovascular: Normal rate, regular rhythm and normal heart sounds. Exam reveals no gallop and no friction rub.  No murmur heard.  Chest wall = port in c/d/i no surrounding erythema Pulmonary/Chest: Effort normal and breath sounds normal. No respiratory distress.  has no wheezes.  Neck = supple, no nuchal rigidity Abdominal: Soft. Bowel sounds are normal.  exhibits no distension. There is no tenderness.  Lymphadenopathy: no cervical adenopathy. No axillary adenopathy Neurological: alert and oriented to person, place, and time.  Skin: Skin is warm and dry. No rash  noted. No erythema.  Ext: pitting edema above ankles to +1 L> R slightly Psychiatric: a normal mood and affect.  behavior is normal.    LABS: Results for orders placed or performed during the hospital encounter of 02/07/18 (from the past 48 hour(s))  Urine culture     Status: Abnormal (Preliminary result)   Collection Time: 02/07/18  9:58 AM  Result Value Ref Range   Specimen Description URINE, CLEAN CATCH    Special Requests      NONE Performed at [facility], [address], [place], [place] [address]    Culture 80,000 COLONIES/mL ESCHERICHIA COLI (A)    Report Status PENDING   Urinalysis, Routine w reflex microscopic     Status: Abnormal   Collection Time: 02/07/18 10:01 AM  Result Value Ref Range   Color, Urine AMBER (A) YELLOW    Comment: BIOCHEMICALS MAY BE AFFECTED BY COLOR   APPearance CLOUDY (A) CLEAR   Specific Gravity, Urine 1.016 1.005 - 1.030   pH 5.0 5.0 - 8.0   Glucose, UA NEGATIVE NEGATIVE mg/dL   Hgb urine dipstick  SMALL (A) NEGATIVE   Bilirubin Urine NEGATIVE NEGATIVE   Ketones, ur NEGATIVE NEGATIVE mg/dL   Protein, ur 30 (A) NEGATIVE mg/dL   Nitrite POSITIVE (A) NEGATIVE   Leukocytes, UA TRACE (A) NEGATIVE   RBC / HPF 0-5 0 - 5 RBC/hpf   WBC, UA 21-50 0 - 5 WBC/hpf   Bacteria, UA MANY (A) NONE SEEN   Squamous Epithelial / LPF 6-10 0 - 5   Mucus PRESENT    Hyaline Casts, UA PRESENT    Non Squamous Epithelial 0-5 (A) NONE SEEN    Comment: Performed at [facility], [place] [address], [place], [place] [address]  Comprehensive metabolic panel     Status: Abnormal   Collection Time: 02/07/18 11:12 AM  Result Value Ref Range   Sodium 140 135 - 145 mmol/L   Potassium 4.0 3.5 - 5.1 mmol/L   Chloride 105 101 - 111 mmol/L   CO2 25 22 - 32 mmol/L   Glucose, Bld 124 (H) 65 - 99 mg/dL   BUN 45 (H) 6 - 20 mg/dL   Creatinine, Ser 1.79 (H) 0.44 - 1.00 mg/dL   Calcium 8.4 (L) 8.9 - 10.3 mg/dL   Total Protein 4.7 (L) 6.5 - 8.1 g/dL   Albumin 2.0 (L) 3.5  - 5.0 g/dL   AST 52 (H) 15 - 41 U/L   ALT 39 14 - 54 U/L   Alkaline Phosphatase 41 38 - 126 U/L   Total Bilirubin 0.7 0.3 - 1.2 mg/dL   GFR calc non Af Amer 26 (L) >60 mL/min   GFR calc Af Amer 30 (L) >60 mL/min    Comment: (NOTE) The eGFR has been calculated using the CKD EPI equation. This calculation has not been validated in all clinical situations. eGFR's persistently <60 mL/min signify possible Chronic Kidney Disease.    Anion gap 10 5 - 15    Comment: Performed at [facility] [address], [place], [place] [address]  CBC with Differential/Platelet     Status: Abnormal   Collection Time: 02/07/18 11:12 AM  Result Value Ref Range   WBC 0.7 (LL) 4.0 - 10.5 K/uL    Comment: REPEATED TO VERIFY CRITICAL RESULT CALLED TO, READ BACK BY AND VERIFIED WITH: B SHANOS RN @ 1136 ON 02/07/18 BY HTEMOCHE    RBC 2.79 (L) 3.87 - 5.11 MIL/uL   Hemoglobin 8.7 (L) 12.0 - 15.0 g/dL   HCT 27.6 (L) 36.0 - 46.0 %   MCV 98.9 78.0 - 100.0 fL   MCH 31.2 26.0 - 34.0 pg   MCHC 31.5 30.0 - 36.0 g/dL   RDW 12.5 11.5 - 15.5 %   Platelets 230 150 - 400 K/uL   Neutrophils Relative % 27 %   Lymphocytes Relative 25 %   Monocytes Relative 41 %   Eosinophils Relative 7 %   Basophils Relative 0 %   Neutro Abs 0.2 (L) 1.7 - 7.7 K/uL   Lymphs Abs 0.2 (L) 0.7 - 4.0 K/uL   Monocytes Absolute 0.3 0.1 - 1.0 K/uL   Eosinophils Absolute 0.0 0.0 - 0.7 K/uL   Basophils Absolute 0.0 0.0 - 0.1 K/uL   WBC Morphology DOHLE BODIES    Smear Review      PATH REVIEW HAS BEEN ORDERED ON THIS ACCESSION NUMBER    Comment: Performed at [facility], [place] [address] [place], [place] [address]  Pathologist smear review     Status: None   Collection Time: 02/07/18  11:12 AM  Result Value Ref Range   Path Review Neutropenia with mild left shift     Comment: and reactive appearing lymphocytes. Reviewed by Lennox Solders Lyndon Code, M.D. 02/08/2018 Performed at [facility], [place] [address] [place], [place]  [address]   Culture, blood (Routine X 2) w Reflex to ID Panel     Status: None (Preliminary result)   Collection Time: 02/07/18 12:34 PM  Result Value Ref Range   Specimen Description BLOOD LEFT ANTECUBITAL    Special Requests      BOTTLES DRAWN AEROBIC AND ANAEROBIC Blood Culture adequate volume   Culture      NO GROWTH < 24 HOURS Performed at [facility], [place] [address] [place], [place] [address]    Report Status PENDING   Culture, blood (Routine X 2) w Reflex to ID Panel     Status: None (Preliminary result)   Collection Time: 02/07/18 12:51 PM  Result Value Ref Range   Specimen Description BLOOD RIGHT ANTECUBITAL    Special Requests      BOTTLES DRAWN AEROBIC AND ANAEROBIC Blood Culture adequate volume   Culture      NO GROWTH < 24 HOURS Performed at [facility], [place] [address], [place], [place] [address]    Report Status PENDING   Basic metabolic panel     Status: Abnormal   Collection Time: 02/08/18  5:53 AM  Result Value Ref Range   Sodium 143 135 - 145 mmol/L   Potassium 4.2 3.5 - 5.1 mmol/L   Chloride 104 98 - 111 mmol/L   CO2 26 22 - 32 mmol/L   Glucose, Bld 114 (H) 70 - 99 mg/dL   BUN 35 (H) 8 - 23 mg/dL   Creatinine, Ser 1.10 (H) 0.44 - 1.00 mg/dL   Calcium 8.1 (L) 8.9 - 10.3 mg/dL   GFR calc non Af Amer 46 (L) >60 mL/min   GFR calc Af Amer 54 (L) >60 mL/min    Comment: (NOTE) The eGFR has been calculated using the CKD EPI equation. This calculation has not been validated in all clinical situations. eGFR's persistently <60 mL/min signify possible Chronic Kidney Disease.    Anion gap 13 5 - 15    Comment: Performed at [facility] [address], [place], [place] [address]  CBC     Status: Abnormal   Collection Time: 02/08/18  5:53 AM  Result Value Ref Range   WBC 1.0 (LL) 4.0 - 10.5 K/uL    Comment: REPEATED TO VERIFY CRITICAL VALUE NOTED.  VALUE IS CONSISTENT WITH PREVIOUSLY REPORTED AND CALLED VALUE.    RBC 3.02 (L) 3.87 - 5.11  MIL/uL   Hemoglobin 9.4 (L) 12.0 - 15.0 g/dL   HCT 31.3 (L) 36.0 - 46.0 %   MCV 103.6 (H) 78.0 - 100.0 fL   MCH 31.1 26.0 - 34.0 pg   MCHC 30.0 30.0 - 36.0 g/dL   RDW 12.6 11.5 - 15.5 %   Platelets 238 150 - 400 K/uL    Comment: Performed at [facility] [address], [place], [place] [address]  Hemoglobin A1c     Status: Abnormal   Collection Time: 02/08/18  5:53 AM  Result Value Ref Range   Hgb A1c MFr Bld 5.7 (H) 4.8 - 5.6 %    Comment: (NOTE) Pre diabetes:          5.7%-6.4% Diabetes:              >  6.4% Glycemic control for   <7.0% adults with diabetes    Mean Plasma Glucose 116.89 mg/dL    Comment: Performed at [facility] [address], [place], [place] [address]    MICRO:  IMAGING: Dg Ribs Unilateral W/chest Right  Result Date: 02/07/2018 CLINICAL DATA:  Acute chest and RIGHT rib pain following fall. Initial encounter. EXAM: RIGHT RIBS AND CHEST - 3+ VIEW COMPARISON:  None. FINDINGS: The cardiomediastinal silhouette is unremarkable. There is no evidence of focal airspace disease, pulmonary edema, suspicious pulmonary nodule/mass, pleural effusion, or pneumothorax. A RIGHT IJ Port-A-Cath is noted with tip overlying the SUPERIOR cavoatrial junction. No acute bony abnormalities are identified. No acute fractures noted. RIGHT shoulder arthroplasty changes present. A apex RIGHT thoracic scoliosis is identified. IMPRESSION: 1. No acute abnormality or evidence of active cardiopulmonary disease. Electronically Signed   By: Margarette Canada M.D.   On: 02/07/2018 11:04   Ct Head Wo Contrast  Result Date: 02/07/2018 CLINICAL DATA:  Lost balance and fell. No loss of consciousness. Generalized headache. Initial encounter. EXAM: CT HEAD WITHOUT CONTRAST TECHNIQUE: Contiguous axial images were obtained from the base of the skull through the vertex without intravenous contrast. COMPARISON:  None. FINDINGS: Brain: There is no evidence of acute infarct, intracranial hemorrhage,  midline shift, or extra-axial fluid collection. The ventricles and sulci are within normal limits for age. Periventricular white matter hypodensities are nonspecific but compatible with mild chronic small vessel ischemic disease. There is a 2.2 x 1.1 cm partially calcified extra-axial mass in the right lateral aspect of the anterior cranial fossa superior to the orbit with associated hyperostosis of the frontal bone and no evidence of significant brain edema. Vascular: Calcified atherosclerosis at the skull base. No hyperdense vessel. Skull: No fracture. Sinuses/Orbits: Visualized paranasal sinuses and mastoid air cells are clear. Bilateral cataract extraction is noted. Other: None. IMPRESSION: 1. No evidence of acute intracranial abnormality. 2. Mild chronic small vessel ischemic disease. 3. 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 No edema. Electronically Signed   By: Logan Bores M.D.   On: 02/07/2018 10:40    Assessment/Plan:  77yo F with hx of bcell lymphoma being treated for c.difficile colitis admitted for recurrent falls of unknown etiology. I am not sure if her falls are infectious related.  - recommend to continue with oral vancomycin 180m PO QID. Track how often she is going to the bathroom. If still very frequent would change course to fidaxomicin  - neutropenia = consider giving GCSF, then may see less fever  - bcell lymphoma = would check to see when she is to see rad onc  - falls = would have pt/ot assessment to see if vestibular in origin

## 2018-02-08 NOTE — Plan of Care (Signed)
  Problem: Activity: Goal: Risk for activity intolerance will decrease Outcome: Progressing   Problem: Safety: Goal: Ability to remain free from injury will improve Outcome: Progressing   Problem: Skin Integrity: Goal: Risk for impaired skin integrity will decrease Outcome: Progressing   

## 2018-02-08 NOTE — Care Management Note (Signed)
Case Management Note  Patient Details  Name: Erica Day MRN: [id number] Date of Birth: 19-Apr-1941  Subjective/Objective:                    Action/Plan: Patient lives alone uses walker , has had falls at home and is currently on low bed in hospital. Will need PT/OT evals. Patient currently on bedrest .  Will continue to follow.  Expected Discharge Date:                  Expected Discharge Plan:     In-House Referral:     Discharge planning Services  CM Consult  Post Acute Care Choice:  Home Health, Durable Medical Equipment Choice offered to:     DME Arranged:    DME Agency:     HH Arranged:    HH Agency:     Status of Service:  In process, will continue to follow  If discussed at Long Length of Stay Meetings, dates discussed:    Additional Comments:  Marilu Favre, RN 02/08/2018, 9:50 AM

## 2018-02-08 NOTE — Discharge Summary (Addendum)
Erica Day, is a 77 y.o. female  DOB [birth year]  MRN [id number].  Admission date:  02/07/2018  Admitting Physician  Karmen Bongo, MD  Discharge Date:  02/08/2018   Primary MD  [facility]  Recommendations for primary care physician for things to follow:  -Further management per [facility] oncology   Admission Diagnosis  Acute lower UTI [N39.0] AKI (acute kidney injury) ([place]) [N17.9] Fall, initial encounter [W19.XXXA] Leukopenia, unspecified type [D72.819]   Discharge Diagnosis  Acute lower UTI [N39.0] AKI (acute kidney injury) ([place]) [N17.9] Fall, initial encounter [W19.XXXA] Leukopenia, unspecified type [D72.819]    Principal Problem:   UTI due to extended-spectrum beta lactamase (ESBL) producing Escherichia coli Active Problems:   DLBCL (diffuse large B cell lymphoma) (HCC)   Sjogren's syndrome (HCC)   Acute renal failure (ARF) (HCC)   Severe protein-calorie malnutrition (HCC)   Leukopenia   Hyperglycemia   Anemia   C. difficile colitis      Past Medical History:  Diagnosis Date  . Arthritis   . C. difficile colitis 01/2018  . Depression   . GERD (gastroesophageal reflux disease)   . Hypertension   . Lymphoma ([place])   . Sjogren's syndrome ([place])   . UTI (urinary tract infection) 01/2018    Past Surgical History:  Procedure Laterality Date  . BREAST REDUCTION SURGERY    . CHOLECYSTECTOMY    . FOOT SURGERY    . REPLACEMENT TOTAL KNEE BILATERAL    . SHOULDER SURGERY    . TONSILLECTOMY    . TOTAL ABDOMINAL HYSTERECTOMY         History of present illness and  Hospital Course:     Kindly see H&P for history of present illness and admission details, please review complete Labs, Consult reports and Test reports for all details in brief  HPI  from the history and physical done on the day of admission 02/07/2018 HPI: Erica Day is a 77  y.o. female with medical history significant of lymphoma, C diff colitis, and Sjogren's syndrome presenting with a fall.  She completed 6 sessions of chemo from Sept to Jan.  PET scan was clear.  Her lymphoma had been in her nose but it cleared after the first treatment.  1-2 months after finishing chemo, "that came back with a vengeance."  She saw ENT and oncology, and then was referred to rad onc.  She was supposed to get measured today.  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  She had been on Clindamycin, stopped it, had the biopsy, and then the ENT started her on Azithromycin.  When she finished it, she still had fevers and so she resumed Clinda.  She developed diarrhea that was very refractory.  She was admitted at [facility] for C diff colitis, discharged last Firiday.  She is still on oral vanc.  She still had fevers at the time of d/c up to 100.9.  They checked with oncology and they thought it was  okay to d/c because the fever might be related to the lymphoma.  She kept having diarrhea and was getting worse, not making sense, feeling funny in the head, feeling very weak.  She has had ongoing diarrhea.  She fell on Sunday and was down about 6 hours.  She fell again today, and her daughter found her on the floor about 45 minutes later.  The phone was in her pocket and she couldn't figure out how to answer it.   She does not normally have urinary issues.  She is continuing to have fevers at least once a day.   ED Course:  H/o lymphoma, followed at [facility].  Started on Clinda - diagnosed on C diff, treated with PO Vanc.  Multiple falls recently.  No abdominal pain.  Baseline creatinine 0.7, currently 1.79.  UA abnormal, concerning for infection.  Blood cultures pending.  Giving IVF and Rocephin.   Dr. Lita Mains called and spoke with [facility] - she recommends treating the infection, trending WBC count; if not improving, she may need a further work-up and possible transfer to  [facility].     Hospital Course   Neutropenia/leukopenia -Have discussed with patient primary oncologist Dr.Vaidya at [facility], patient with leukopenia when left [facility] hospital, as well low-grade temperature, it was felt then to be secondary to sepsis, but now actually it is progressing, there is a concern lymphoma involving bone marrow, and she would like her to have bone marrow biopsy, so she is requesting patient to be transferred to [facility] oncology service. -I have discussed with Dr. Norma Fredrickson who accepted the patient for transfer, there are no beds availability till tomorrow, will have morning team call [facility] transfer center update oncology team at [facility] if patient is stable and still requiring medical bed before transfer . -We will start on Granix, continue to follow CBC with differentials   Fever -Patient continues to have intermittent fever, actually she [facility] 100.[facility], T-max is 100.3, so becomes question if this is neutropenic fever secondary to infection, her work-up significant for positive urine analysis, she denies any urinary symptoms, urine culture at [facility] to growing ESBL, started initially on meropenem, I have discussed with ID, given she denies any urinary symptoms, she is stable, and history of C. difficile diarrhea, will hold on antibiotics and continue to monitor closely. -Follow blood cultures, so far negative  Lymphoma -Nasal sinus disease -She was diagnosed in [facility] and has completed 6 cycles of RCHIP -She has been recommended to have targeted radiotherapy -Plan was to start treatment planning a day of admission this will have to be rescheduled.  Anemia -Baseline Hgb appears to be about 10 -Will follow with daily CBC  C. difficile diarrhea -Patient was diagnosed while at [facility] -She was started on PO Vancomycin and is scheduled to end on 6/28 -Continue PO Vanc for now  AKI -Rating 1.7 on admission, was 0.69 on  6/21, improved with IV fluids        Discharge Condition:  Transfer to [facility]   Follow UP  Further management per [facility]      Discharge Instructions  and  Discharge Medications      Allergies as of 02/08/2018      Reactions   Benylin Cough [diphenhydramine] Anaphylaxis, Shortness Of Breath   Codeine Nausea And Vomiting   Tape Other (See Comments)   Pt states makes her skin burn  Transparent dressing causes a rash per patient - Please use mepilex   Aloe Vera Rash   Turn to sores   Penicillins Rash   Has patient had a PCN reaction causing immediate rash, facial/tongue/throat swelling, SOB or lightheadedness with hypotension: No Has patient had a PCN reaction causing severe rash involving mucus membranes or skin necrosis: No Has patient had a PCN reaction that required hospitalization: No Has patient had a PCN reaction occurring within the last 10 years: Yes If all of the above answers are "NO", then may proceed with Cephalosporin use.   Sertraline Anxiety   Sulfa Antibiotics Rash      Medication List    STOP taking these medications   allopurinol 100 MG tablet Commonly known as:  ZYLOPRIM   amLODipine-olmesartan 10-40 MG tablet Commonly known as:  AZOR   furosemide 20 MG tablet Commonly known as:  LASIX   hydrochlorothiazide 12.5 MG tablet Commonly known as:  HYDRODIURIL   meloxicam 15 MG tablet Commonly known as:  MOBIC     TAKE these medications   BETIMOL 0.5 % ophthalmic solution Generic drug:  timolol Place 1 drop into both eyes daily.   docusate sodium 100 MG capsule Commonly known as:  COLACE Take 100 mg by mouth as needed for mild constipation.   folic acid 1 MG tablet Commonly known as:  FOLVITE Take 1 mg by mouth daily.   gabapentin 400 MG capsule Commonly known as:  NEURONTIN Take 400 mg by mouth at bedtime.   lidocaine-prilocaine cream Commonly known as:  EMLA Apply 1  application topically as needed (port).   metoprolol tartrate 50 MG tablet Commonly known as:  LOPRESSOR Take 50 mg by mouth 2 (two) times daily.   mirabegron ER 50 MG Tb24 tablet Commonly known as:  MYRBETRIQ Take 50 mg by mouth at bedtime.   multivitamin capsule Take 1 capsule by mouth daily.   nystatin powder Commonly known as:  MYCOSTATIN/NYSTOP Apply 1 application topically 2 (two) times daily as needed (under breast and back).   ondansetron 8 MG tablet Commonly known as:  ZOFRAN Take 8 mg by mouth every 8 (eight) hours as needed for nausea or vomiting.   potassium chloride SA 20 MEQ tablet Commonly known as:  K-DUR,KLOR-CON Take 20 mEq by mouth every other day.   ranitidine 150 MG tablet Commonly known as:  ZANTAC Take 150 mg by mouth 2 (two) times daily.   simvastatin 20 MG tablet Commonly known as:  ZOCOR Take 20 mg by mouth at bedtime.   Tbo-Filgrastim 480 MCG/0.8ML Sosy injection Commonly known as:  GRANIX Inject 0.8 mLs (480 mcg total) into the skin daily at 6 PM.   temazepam 30 MG capsule Commonly known as:  RESTORIL Take 30 mg by mouth at bedtime as needed for sleep.   Travoprost (BAK Free) 0.004 % Soln ophthalmic solution Commonly known as:  TRAVATAN Place 1 drop into both eyes at bedtime.   vancomycin 125 MG capsule Commonly known as:  VANCOCIN Take 125 mg by mouth 3 (three) times daily.         Diet and Activity recommendation: See Discharge Instructions above   Consults obtained -  D/W ID at [facility] Dr. Karolee Ohs Discussed with oncology at [facility],, primary oncologist Dr Cassell Clement, accepting oncolgist dr Doneen Poisson.   Major procedures and Radiology Reports - PLEASE review detailed and final reports for all details, in brief -     Dg Ribs Unilateral W/chest Right  Result Date: 02/07/2018 CLINICAL DATA:  Acute chest and RIGHT rib pain following fall. Initial encounter. EXAM: RIGHT RIBS AND CHEST - 3+ VIEW  COMPARISON:  None. FINDINGS: The cardiomediastinal silhouette is unremarkable. There is no evidence of focal airspace disease, pulmonary edema, suspicious pulmonary nodule/mass, pleural effusion, or pneumothorax. A RIGHT IJ Port-A-Cath is noted with tip overlying the SUPERIOR cavoatrial junction. No acute bony abnormalities are identified. No acute fractures noted. RIGHT shoulder arthroplasty changes present. A apex RIGHT thoracic scoliosis is identified. IMPRESSION: 1. No acute abnormality or evidence of active cardiopulmonary disease. Electronically Signed   By: Margarette Canada M.D.   On: 02/07/2018 11:04   Ct Head Wo Contrast  Result Date: 02/07/2018 CLINICAL DATA:  Lost balance and fell. No loss of consciousness. Generalized headache. Initial encounter. EXAM: CT HEAD WITHOUT CONTRAST TECHNIQUE: Contiguous axial images were obtained from the base of the skull through the vertex without intravenous contrast. COMPARISON:  None. FINDINGS: Brain: There is no evidence of acute infarct, intracranial hemorrhage, midline shift, or extra-axial fluid collection. The ventricles and sulci are within normal limits for age. Periventricular white matter hypodensities are nonspecific but compatible with mild chronic small vessel ischemic disease. There is a 2.2 x 1.1 cm partially calcified extra-axial mass in the right lateral aspect of the anterior cranial fossa superior to the orbit with associated hyperostosis of the frontal bone and no evidence of significant brain edema. Vascular: Calcified atherosclerosis at the skull base. No hyperdense vessel. Skull: No fracture. Sinuses/Orbits: Visualized paranasal sinuses and mastoid air cells are clear. Bilateral cataract extraction is noted. Other: None. IMPRESSION: 1. No evidence of acute intracranial abnormality. 2. Mild chronic small vessel ischemic disease. 3. 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 No edema. Electronically Signed   By: Logan Bores M.D.    On: 02/07/2018 10:40    Micro Results    Recent Results (from the past 240 hour(s))  Urine culture     Status: Abnormal (Preliminary result)   Collection Time: 02/07/18  9:58 AM  Result Value Ref Range Status   Specimen Description URINE, CLEAN CATCH  Final   Special Requests   Final    NONE Performed at [facility], [place] [address] [place], [place] [address]    Culture 80,000 COLONIES/mL ESCHERICHIA COLI (A)  Final   Report Status PENDING  Incomplete  Culture, blood (Routine X 2) w Reflex to ID Panel     Status: None (Preliminary result)   Collection Time: 02/07/18 12:34 PM  Result Value Ref Range Status   Specimen Description BLOOD LEFT ANTECUBITAL  Final   Special Requests   Final    BOTTLES DRAWN AEROBIC AND ANAEROBIC Blood Culture adequate volume   Culture   Final    NO GROWTH < 24 HOURS Performed at [facility], [place] [address] [place], [place] [address]    Report Status PENDING  Incomplete  Culture, blood (Routine X 2) w Reflex to ID Panel     Status: None (Preliminary result)   Collection Time: 02/07/18 12:51 PM  Result Value Ref Range Status   Specimen Description BLOOD RIGHT ANTECUBITAL  Final   Special Requests   Final    BOTTLES DRAWN AEROBIC AND ANAEROBIC Blood Culture adequate volume   Culture   Final    NO GROWTH < 24 HOURS Performed at [facility], [place] [address], [place], [place] [address]    Report Status PENDING  Incomplete  Today   Subjective:   Alease Fait today has no headache,no chest pain she reported generalized weakness, but already feeling better today, has any profound diarrhea, reports only Smir after bowel movements .  Objective:   Blood pressure (!) 141/83, pulse 85, temperature 98.4 F (36.9 C), temperature source Oral, resp. rate 16, height 5' 7"  (1.702 m), weight 59 kg (130 lb), SpO2 100 %.   Intake/Output Summary (Last 24 hours) at 02/08/2018 1600 Last data filed at 02/08/2018 1421 Gross per 24  hour  Intake 1612.77 ml  Output -  Net 1612.77 ml    Exam Awake Alert, Oriented x 3, No new F.N deficits, Normal affect Symmetrical Chest wall movement, Good air movement bilaterally, CTAB RRR,No Gallops,Rubs or new Murmurs, No Parasternal Heave +ve B.Sounds, Abd Soft, Non tender, No rebound -guarding or rigidity. No Cyanosis, Clubbing or edema, No new Rash or bruise  Data Review   CBC w Diff:  Lab Results  Component Value Date   WBC 1.0 (LL) 02/08/2018   HGB 9.4 (L) 02/08/2018   HCT 31.3 (L) 02/08/2018   PLT 238 02/08/2018   LYMPHOPCT 34 02/08/2018   BANDSPCT 0 02/08/2018   MONOPCT 35 02/08/2018   EOSPCT 4 02/08/2018   BASOPCT 0 02/08/2018    CMP:  Lab Results  Component Value Date   NA 143 02/08/2018   K 4.2 02/08/2018   CL 104 02/08/2018   CO2 26 02/08/2018   BUN 35 (H) 02/08/2018   CREATININE 1.10 (H) 02/08/2018   PROT 4.7 (L) 02/07/2018   ALBUMIN 2.0 (L) 02/07/2018   BILITOT 0.7 02/07/2018   ALKPHOS 41 02/07/2018   AST 52 (H) 02/07/2018   ALT 39 02/07/2018  .   Total Time in preparing paper work, data evaluation and todays exam - 2 minutes  Phillips Climes M.D on 02/08/2018 at 4:00 PM  [facility]   Office  [phone]

## 2018-02-08 NOTE — Progress Notes (Signed)
Nutrition Brief Note  Patient identified on the Malnutrition Screening Tool (MST) Report  Wt Readings from Last 15 Encounters:  02/07/18 130 lb (59 kg)   Erica Day is a 77 y.o. female with medical history significant of lymphoma, C diff colitis, and Sjogren's syndrome presenting with a fall.    Pt admitted with ESBL UTI.  Pt sleeping soundly at time of visit. She did not arouse to sound of voice.   Reviewed wt hx from [place]; wt has ranged from 124-132# within the past year. Noted that pt has regained lost weight within the past few months (wt of 124# on 12/01/17).   No fat or muscle depletions observed on exam.   Labs reviewed.   Body mass index is 20.36 kg/m. Patient meets criteria for normal weight range based on current BMI.   Current diet order is regular, patient is consuming approximately 80% of meals at this time. Labs and medications reviewed.   No nutrition interventions warranted at this time. If nutrition issues arise, please consult RD.   Tyreak Reagle A. Jimmye Norman, RD, LDN, CDE Pager: [phone] After hours Pager: [phone]

## 2018-02-09 DIAGNOSIS — W19XXXA Unspecified fall, initial encounter: Secondary | ICD-10-CM

## 2018-02-09 LAB — CBC WITH DIFFERENTIAL/PLATELET
Basophils Absolute: 0 10*3/uL (ref 0.0–0.1)
Basophils Relative: 1 %
EOS ABS: 0.1 10*3/uL (ref 0.0–0.7)
EOS PCT: 5 %
HCT: 24 % — ABNORMAL LOW (ref 36.0–46.0)
Hemoglobin: 7.7 g/dL — ABNORMAL LOW (ref 12.0–15.0)
Lymphocytes Relative: 32 %
Lymphs Abs: 0.3 10*3/uL — ABNORMAL LOW (ref 0.7–4.0)
MCH: 31.3 pg (ref 26.0–34.0)
MCHC: 32.1 g/dL (ref 30.0–36.0)
MCV: 97.6 fL (ref 78.0–100.0)
MONO ABS: 0.4 10*3/uL (ref 0.1–1.0)
Monocytes Relative: 32 %
NEUTROS ABS: 0.3 10*3/uL — AB (ref 1.7–7.7)
Neutrophils Relative %: 30 %
PLATELETS: 222 10*3/uL (ref 150–400)
RBC: 2.46 MIL/uL — ABNORMAL LOW (ref 3.87–5.11)
RDW: 12.6 % (ref 11.5–15.5)
WBC: 1.1 10*3/uL — CL (ref 4.0–10.5)

## 2018-02-09 LAB — BASIC METABOLIC PANEL
Anion gap: 10 (ref 5–15)
BUN: 23 mg/dL (ref 8–23)
CHLORIDE: 108 mmol/L (ref 98–111)
CO2: 24 mmol/L (ref 22–32)
CREATININE: 0.95 mg/dL (ref 0.44–1.00)
Calcium: 7.9 mg/dL — ABNORMAL LOW (ref 8.9–10.3)
GFR calc Af Amer: >60 mL/min (ref >60)
GFR, EST NON AFRICAN AMERICAN: 55 mL/min — AB (ref >60)
GLUCOSE: 115 mg/dL — AB (ref 70–99)
Potassium: 3.9 mmol/L (ref 3.5–5.1)
Sodium: 142 mmol/L (ref 135–145)

## 2018-02-09 NOTE — Progress Notes (Signed)
  PROGRESS NOTE  Patient seen and examined. I reviewed discharge and transfer recommendations to [facility] oncology service by my partner. Patient without any new complaints today. She denies dizziness, lightheadedness, no abdominal pain. She had a small semi-formed bowel movement last night which was brown without blood. She ate breakfast this morning without complaints. She does not know why she needs to go to [facility]. I discussed with her and her daughter over the phone regarding Dr. Cassell Clement recommendation for bone marrow biopsy. They are agreeable for transfer. She remains stable for discharge to med-surg unit. Her BP this morning is 123/68 and had a fever 100.6.   Spoke with Dr. Norma Fredrickson and [facility] transfer line. Patient is awaiting transfer hopefully later this afternoon.    Dessa Phi, DO [facility] [email or web address] Password [facility] 02/09/2018, 9:51 AM

## 2018-02-10 LAB — URINE CULTURE: Culture: 80000 — AB

## 2018-02-12 LAB — CULTURE, BLOOD (ROUTINE X 2)
CULTURE: NO GROWTH
CULTURE: NO GROWTH
SPECIAL REQUESTS: ADEQUATE
Special Requests: ADEQUATE

## 2018-09-11 IMAGING — CR DG RIBS W/ CHEST 3+V*R*
4 series · 4 of 4 positions shown · non-contrast
Comparison: None.

CLINICAL DATA: Acute chest and RIGHT rib pain following fall.
Initial encounter.

EXAM:
RIGHT RIBS AND CHEST - 3+ VIEW

[chest ap]
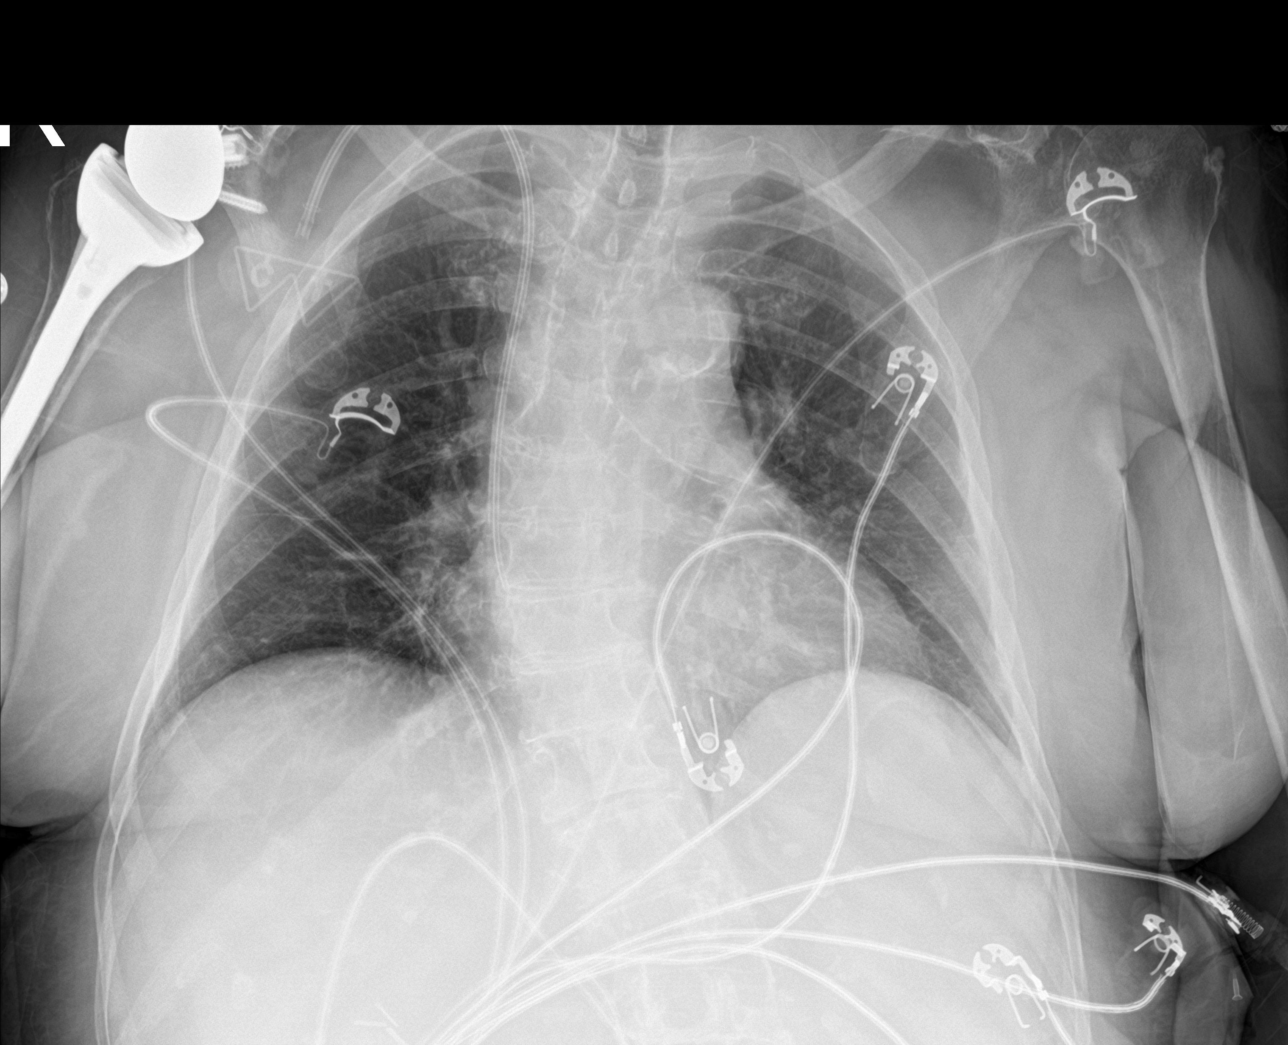

[rib ap (1 of 2)]
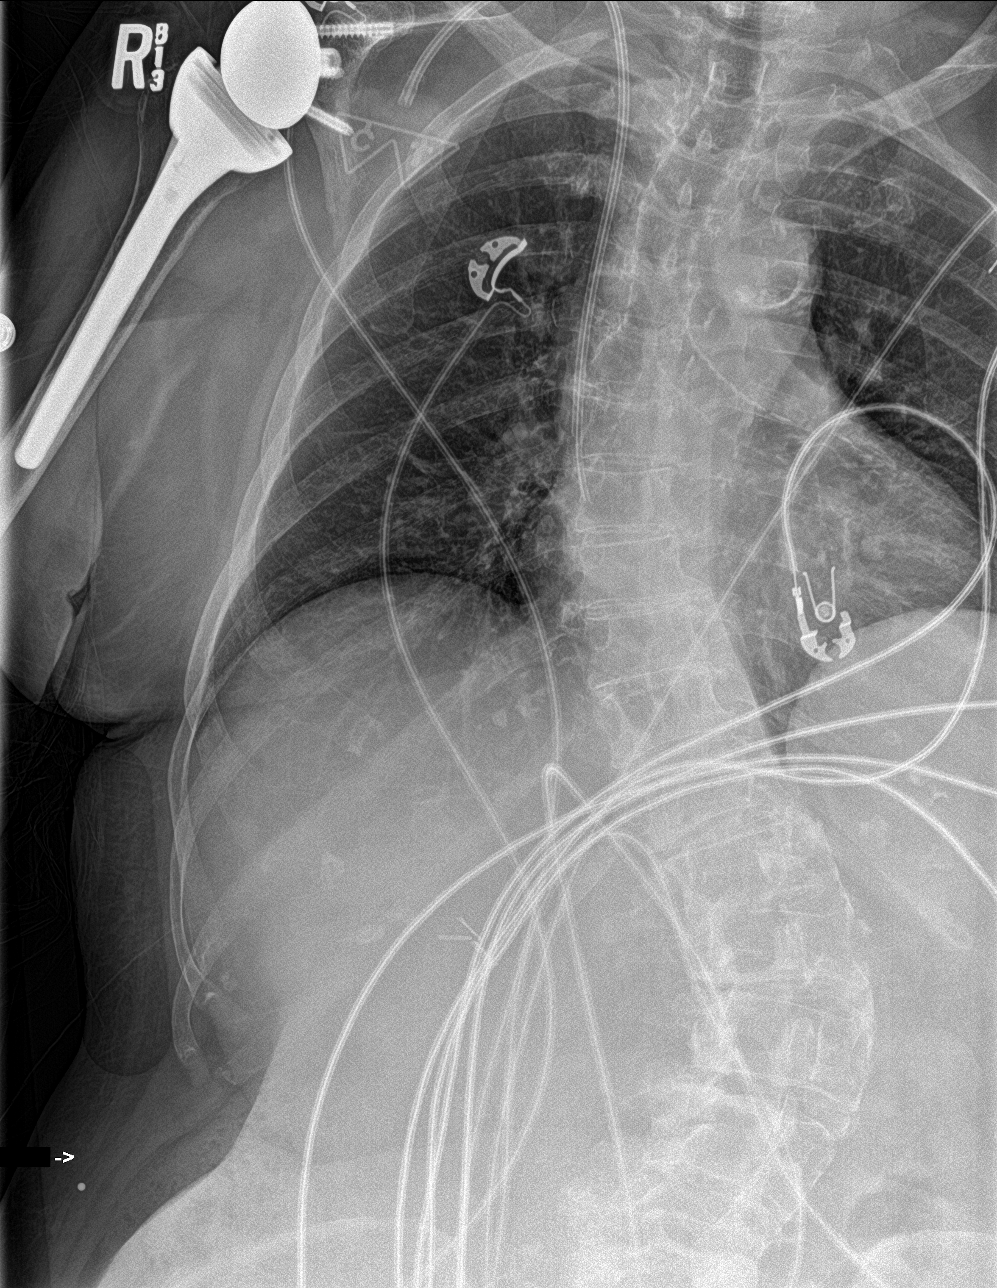

[rib ap obl]
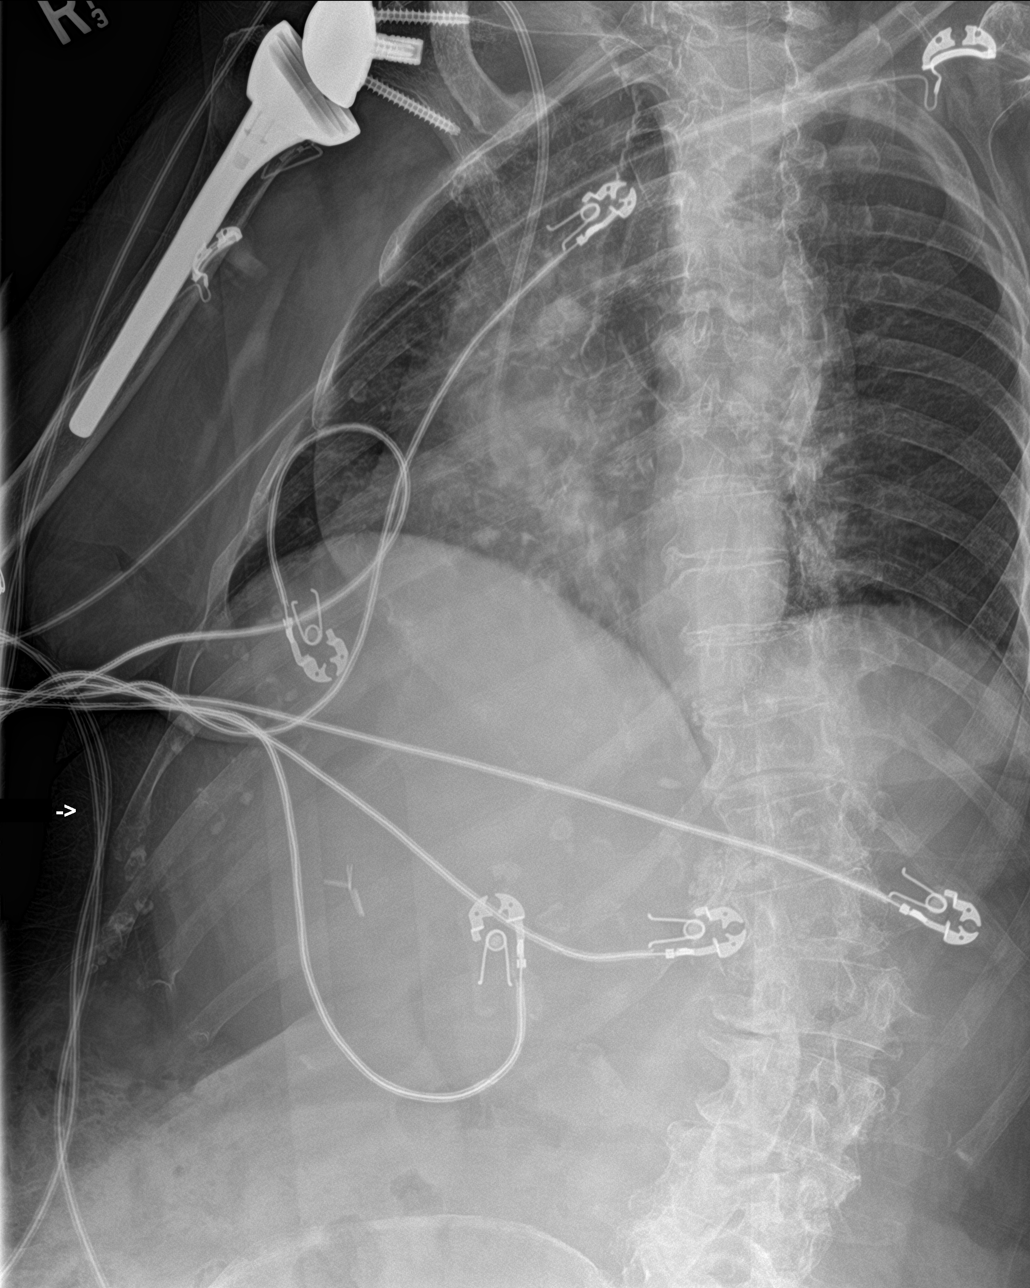

[rib ap (2 of 2)]
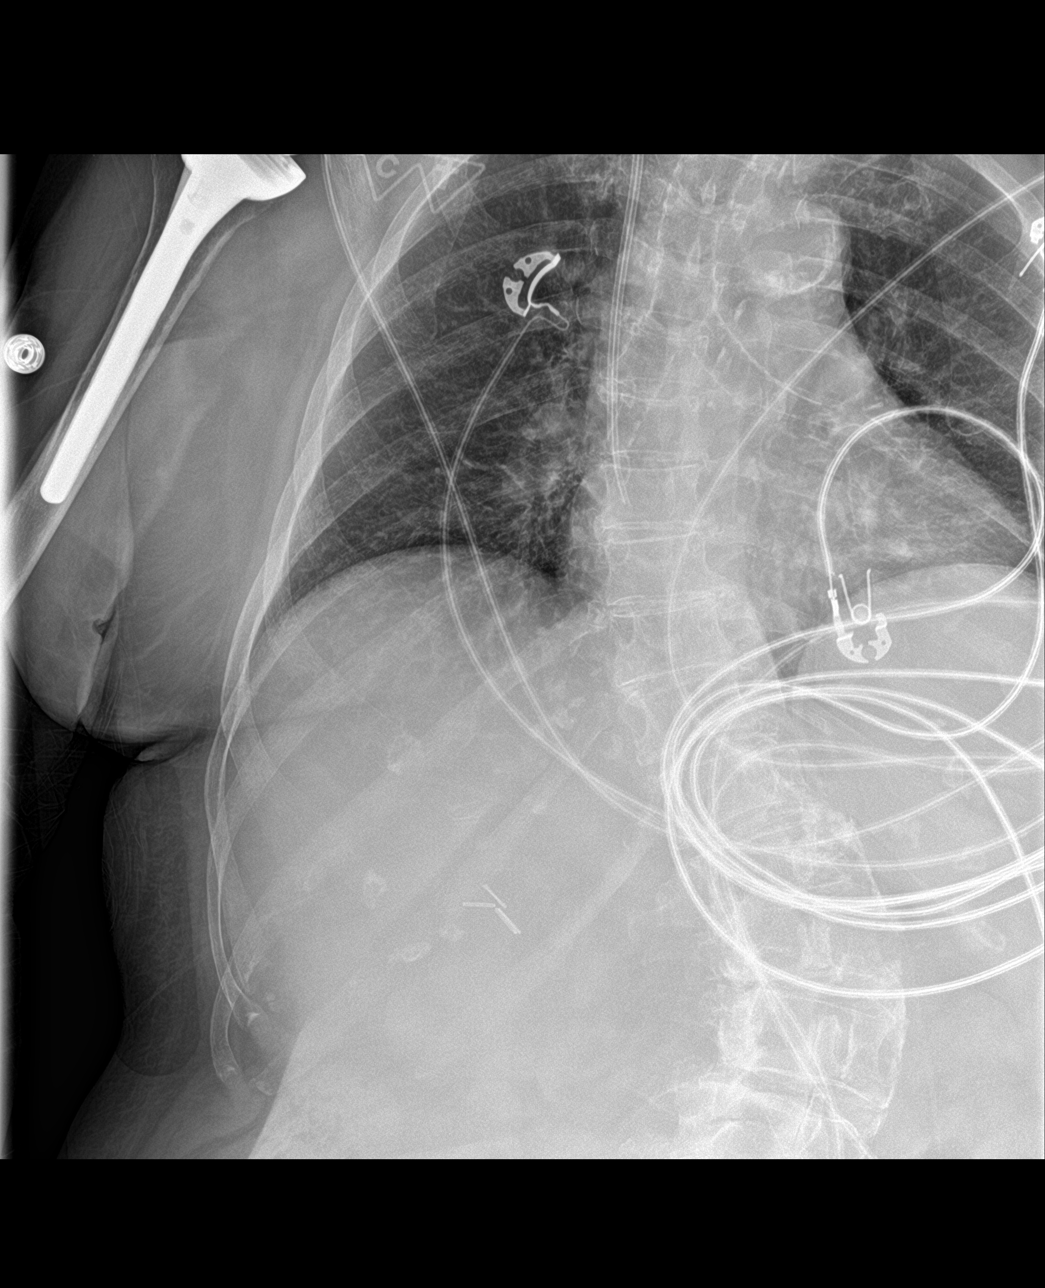

[4 of 4 positions shown; findings below may reference images not displayed]

FINDINGS: The cardiomediastinal silhouette is unremarkable.

There is no evidence of focal airspace disease, pulmonary edema,
suspicious pulmonary nodule/mass, pleural effusion, or pneumothorax.

A RIGHT IJ Port-A-Cath is noted with tip overlying the SUPERIOR
cavoatrial junction.

No acute bony abnormalities are identified. No acute fractures
noted.

RIGHT shoulder arthroplasty changes present.

A apex RIGHT thoracic scoliosis is identified.
IMPRESSION: 1. No acute abnormality or evidence of active cardiopulmonary
disease.

## 2020-02-16 ENCOUNTER — Encounter (HOSPITAL_COMMUNITY): Payer: Self-pay

## 2020-02-16 ENCOUNTER — Emergency Department (HOSPITAL_COMMUNITY)
Admission: EM | Admit: 2020-02-16 | Discharge: 2020-02-16 | Disposition: A | Discharge status: Home / Self Care | Payer: Medicare Other | Attending: Emergency Medicine | Admitting: Emergency Medicine

## 2020-02-16 ENCOUNTER — Emergency Department (HOSPITAL_COMMUNITY): Payer: Medicare Other

## 2020-02-16 DIAGNOSIS — M7532 Calcific tendinitis of left shoulder: Secondary | ICD-10-CM | POA: Diagnosis not present

## 2020-02-16 DIAGNOSIS — Z79899 Other long term (current) drug therapy: Secondary | ICD-10-CM | POA: Diagnosis not present

## 2020-02-16 DIAGNOSIS — M25551 Pain in right hip: Secondary | ICD-10-CM | POA: Insufficient documentation

## 2020-02-16 DIAGNOSIS — I10 Essential (primary) hypertension: Secondary | ICD-10-CM | POA: Insufficient documentation

## 2020-02-16 DIAGNOSIS — G8929 Other chronic pain: Secondary | ICD-10-CM | POA: Diagnosis not present

## 2020-02-16 LAB — BASIC METABOLIC PANEL
Anion gap: 11 (ref 5–15)
BUN: 40 mg/dL — ABNORMAL HIGH (ref 8–23)
CO2: 30 mmol/L (ref 22–32)
Calcium: 9.3 mg/dL (ref 8.9–10.3)
Chloride: 97 mmol/L — ABNORMAL LOW (ref 98–111)
Creatinine, Ser: 1.48 mg/dL — ABNORMAL HIGH (ref 0.44–1.00)
GFR calc Af Amer: 38 mL/min — ABNORMAL LOW (ref >60)
GFR calc non Af Amer: 33 mL/min — ABNORMAL LOW (ref >60)
Glucose, Bld: 146 mg/dL — ABNORMAL HIGH (ref 70–99)
Potassium: 3.7 mmol/L (ref 3.5–5.1)
Sodium: 138 mmol/L (ref 135–145)

## 2020-02-16 LAB — CBC
HCT: 36.5 % (ref 36.0–46.0)
Hemoglobin: 11.8 g/dL — ABNORMAL LOW (ref 12.0–15.0)
MCH: 31.7 pg (ref 26.0–34.0)
MCHC: 32.3 g/dL (ref 30.0–36.0)
MCV: 98.1 fL (ref 80.0–100.0)
Platelets: 201 10*3/uL (ref 150–400)
RBC: 3.72 MIL/uL — ABNORMAL LOW (ref 3.87–5.11)
RDW: 12.2 % (ref 11.5–15.5)
WBC: 9.9 10*3/uL (ref 4.0–10.5)
nRBC: 0 % (ref 0.0–0.2)

## 2020-02-16 NOTE — ED Notes (Signed)
Pt transported to XRAY °

## 2020-02-16 NOTE — Discharge Instructions (Signed)
For your left shoulder pain we are referring you to orthopedics for calcified tendinitis.  I recommend you use Voltaren gel on your left shoulder in the musculature leading from your shoulder to your neck.  You can also use Tylenol 650 mg every 6 hours as needed for pain.  I want to avoid NSAIDs like ibuprofen or meloxicam due to your previous kidney function.

## 2020-02-16 NOTE — ED Notes (Signed)
RN discussed discharge instructions with pt and daughter/caregiver at bedside. Pt was able to verbalized understanding with no questions at this time.

## 2020-02-16 NOTE — ED Provider Notes (Signed)
[place] EMERGENCY DEPARTMENT Provider Note   CSN: [id number] Arrival date & time: 02/16/20  1138     History Chief Complaint  Patient presents with  . Fall    Erica Day is a 79 y.o. female.  Patient is a 79 year old female with past medical history significant for lymphoma, Sjogren's syndrome, arthritis who presents with worsening left shoulder pain, right hip pain after a fall which occurred on 6/16.  Patient states that on June 9 her physician discontinued her meloxicam which she takes on a regular basis due to arthritis, because of worsening kidney function (Cr. 1.71 at that time).  She states that since this time her pain has been worsening.  She states that the day after her fall she had no difficulty moving her shoulder or ambulating with a cane as she usually does.  However approximately 3 days ago she states the pain began worsening particularly in her left shoulder.  She states that now she is unable to move her shoulder because of worsening pain which is located on the posterior aspect of the left shoulder.  She states she also has some increased pain of her right hip and that she has been walking with a walker and not ambulating as much as usual lately, though on further questioning she states she has been ambulating less because of the fear of another fall.  She states that she is supposed to start buprenorphine patches for her chronic pain through the pain clinic, however she has not had a chance to have her next appointment and preliminary drug screen before this initiates.        Past Medical History:  Diagnosis Date  . Arthritis   . C. difficile colitis 01/2018  . Depression   . GERD (gastroesophageal reflux disease)   . Hypertension   . Lymphoma ([place])   . Sjogren's syndrome ([place])   . UTI (urinary tract infection) 01/2018    Patient Active Problem List   Diagnosis Date Noted  . Fall   . UTI due to extended-spectrum beta lactamase (ESBL)  producing Escherichia coli 02/07/2018  . 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 Sjogren's syndrome ([place]) 02/07/2018  . Acute renal failure (ARF) ([place]) 02/07/2018  . Severe protein-calorie malnutrition ([place]) 02/07/2018  . Leukopenia 02/07/2018  . Hyperglycemia 02/07/2018  . Anemia 02/07/2018  . C. difficile colitis 02/07/2018    Past Surgical History:  Procedure Laterality Date  . BREAST REDUCTION SURGERY    . CHOLECYSTECTOMY    . FOOT SURGERY    . REPLACEMENT TOTAL KNEE BILATERAL    . SHOULDER SURGERY    . TONSILLECTOMY    . TOTAL ABDOMINAL HYSTERECTOMY       OB History   No obstetric history on file.     No family history on file.  Social History   Tobacco Use  . Smoking status: Never Smoker  . Smokeless tobacco: Never Used  Vaping Use  . Vaping Use: Never used  Substance Use Topics  . Alcohol use: Never  . Drug use: Never    Home Medications Prior to Admission medications   Medication Sig Start Date End Date Taking? Authorizing Provider  docusate sodium (COLACE) 100 MG capsule Take 100 mg by mouth as needed for mild constipation.    [provider]  folic acid (FOLVITE) 1 MG tablet Take 1 mg by mouth daily. 04/20/17   [provider]  gabapentin (NEURONTIN) 400 MG capsule Take 400 mg by  mouth at bedtime. 12/02/17   [provider]  lidocaine-prilocaine (EMLA) cream Apply 1 application topically as needed (port).     [provider]  metoprolol tartrate (LOPRESSOR) 50 MG tablet Take 50 mg by mouth 2 (two) times daily. 12/08/17   [provider]  mirabegron ER (MYRBETRIQ) 50 MG TB24 tablet Take 50 mg by mouth at bedtime.    [provider]  Multiple Vitamin (MULTIVITAMIN) capsule Take 1 capsule by mouth daily.    [provider]  nystatin (MYCOSTATIN/NYSTOP) powder Apply 1 application topically 2 (two) times daily as needed (under breast and back).     [provider]    ondansetron (ZOFRAN) 8 MG tablet Take 8 mg by mouth every 8 (eight) hours as needed for nausea or vomiting.  02/07/18   [provider]  potassium chloride SA (K-DUR,KLOR-CON) 20 MEQ tablet Take 20 mEq by mouth every other day.    [provider]  ranitidine (ZANTAC) 150 MG tablet Take 150 mg by mouth 2 (two) times daily. 12/01/17   [provider]  simvastatin (ZOCOR) 20 MG tablet Take 20 mg by mouth at bedtime. 12/08/17   [provider]  Tbo-Filgrastim (GRANIX) 480 MCG/0.8ML SOSY injection Inject 0.8 mLs (480 mcg total) into the skin daily at 6 PM. 02/08/18   Elgergawy, Silver Huguenin, MD  temazepam (RESTORIL) 30 MG capsule Take 30 mg by mouth at bedtime as needed for sleep.  12/01/17 12/01/18  [provider]  timolol (BETIMOL) 0.5 % ophthalmic solution Place 1 drop into both eyes daily.    [provider]  Travoprost, BAK Free, (TRAVATAN) 0.004 % SOLN ophthalmic solution Place 1 drop into both eyes at bedtime.    [provider]  vancomycin (VANCOCIN) 125 MG capsule Take 125 mg by mouth 3 (three) times daily.  02/04/18   [provider]    Allergies    Benylin cough [diphenhydramine], Codeine, Tape, Aloe vera, Penicillins, Sertraline, and Sulfa antibiotics  Review of Systems   Review of Systems  Constitutional: Negative for fever.  HENT: Negative for sore throat.   Eyes: Negative for visual disturbance.  Respiratory: Negative for shortness of breath.   Cardiovascular: Negative for chest pain.  Gastrointestinal: Negative for diarrhea.  Musculoskeletal: Negative for joint swelling and neck pain.  Neurological: Positive for headaches. Negative for dizziness, weakness and numbness.    Physical Exam Updated Vital Signs BP (!) 116/50 (BP Location: Right Arm)   Pulse 75   Temp 99.1 F (37.3 C) (Oral)   Resp 16   SpO2 97%   Physical Exam Constitutional:      General: She is not in acute distress.    Appearance: She is not  ill-appearing.  HENT:     Head: Normocephalic and atraumatic.     Nose: No congestion.  Cardiovascular:     Rate and Rhythm: Normal rate and regular rhythm.     Heart sounds: No murmur heard.   Pulmonary:     Effort: Pulmonary effort is normal. No respiratory distress.     Breath sounds: Normal breath sounds.  Abdominal:     General: Bowel sounds are normal. There is no distension.     Palpations: Abdomen is soft.     Tenderness: There is no abdominal tenderness.  Musculoskeletal:        General: Tenderness (Posterior left shoulder in region of trapezius musculature) present. No swelling.  Skin:    General: Skin is warm and dry.  Neurological:  General: No focal deficit present.     Cranial Nerves: No cranial nerve deficit.     Motor: Weakness (5/5 strength right elbow extension, 4/5 on left) present.  Psychiatric:        Mood and Affect: Mood normal.        Behavior: Behavior normal.     ED Results / Procedures / Treatments   Labs (all labs ordered are listed, but only abnormal results are displayed) Labs Reviewed  CBC - Abnormal; Notable for the following components:      Result Value   RBC 3.72 (*)    Hemoglobin 11.8 (*)    All other components within normal limits  BASIC METABOLIC PANEL - Abnormal; Notable for the following components:   Chloride 97 (*)    Glucose, Bld 146 (*)    BUN 40 (*)    Creatinine, Ser 1.48 (*)    GFR calc non Af Amer 33 (*)    GFR calc Af Amer 38 (*)    All other components within normal limits    EKG None  Radiology DG Shoulder Left  Result Date: 02/16/2020 CLINICAL DATA:  Shoulder pain after fall EXAM: LEFT SHOULDER - 2+ VIEW COMPARISON:  None. FINDINGS: No acute displaced fracture or dislocation. Moderate AC joint degenerative change. Multiple calcified loose bodies about the shoulder. Prominent calcific tendinitis. IMPRESSION: 1. No acute osseous abnormality. 2. Calcific tendinitis.  Multiple calcified loose bodies.  Electronically Signed   By: Donavan Foil M.D.   On: 02/16/2020 18:16   DG Hip Unilat W or Wo Pelvis 2-3 Views Right  Result Date: 02/16/2020 CLINICAL DATA:  Hip pain after fall EXAM: DG HIP (WITH OR WITHOUT PELVIS) 2-3V RIGHT COMPARISON:  None. FINDINGS: Vascular calcifications. SI joints are non widened. Pubic symphysis and rami are intact. No fracture or dislocation. Mild degenerative changes of both hips. IMPRESSION: No acute osseous abnormality. Electronically Signed   By: Donavan Foil M.D.   On: 02/16/2020 18:21    Procedures Procedures (including critical care time)  Medications Ordered in ED Medications - No data to display  ED Course  I have reviewed the triage vital signs and the nursing notes.  Pertinent labs & imaging results that were available during my care of the patient were reviewed by me and considered in my medical decision making (see chart for details).  Shoulder x-ray significant for calcific tendinitis with multiple calcified loose bodies but no acute osseous abnormality.  Moderate AC joint degenerative change also present.  Hip x-ray without acute osseous abnormality with mild degenerative changes present of both hips.   MDM Rules/Calculators/A&P                          Patient is increasing left shoulder pain and reduced motion is suggestive of soft tissue injury versus adhesive capsulitis.  We will get left shoulder x-ray to further evaluate.  Will check x-ray of patient's right hip to evaluate for any fractures.  Shoulder x-ray significant for calcific tendinitis multiple calcified loose bodies but no acute osseous abnormality.  Patient symptoms consistent with chronic tendinitis versus adhesive capsulitis.  Provided patient contact information for on-call orthopedic physician to make follow-up appointment..  Recommend patient continue her at home Voltaren gel as well as acetaminophen for pain.  Recommended patient avoid NSAIDs as she recently had meloxicam  discontinued due to elevated creatinine.  Patient discharged home.  Final Clinical Impression(s) / ED Diagnoses Final diagnoses:  Chronic right  hip pain  Calcific tendinitis of left shoulder    Rx / DC Orders ED Discharge Orders    None       Lurline Del, DO 02/16/20 1934    Quintella Reichert, MD 02/16/20 [phone]

## 2020-02-16 NOTE — ED Triage Notes (Signed)
Pt arrives to ED w/ c/o fall on 6/16. Pt family states she was doing well after fall until pain clinic took her off anti-inflammatory, since than pt c/o 10/10 pain to L shoulder, back, and neck.

## 2022-11-23 NOTE — Progress Notes (Signed)
 [facility] Cardi[facility] [place] Outpatient Visit  PCP: Murray CHRISTELLA Amos, MD  Reason for Visit:   Chest discomfort  Assessment/Plan   1. Precordial pain   2. Essential (primary) hypertension   3. Pure hypercholesterolemia, unspecified    Doing well from a cardiac standpoint  Chronic complaints of  chest discomfort  Dyslipidemia, on therapy, at target Hypertension, well controlled Patient was concerned she was going to have a stress test done.  She has not followed through with this before.  We will schedule a Lexiscan nuclear study as she is not walking well. Her cholesterol and blood pressure appear to be well-controlled. Hospital records reviewed Recent cardiac tests reviewed and summarized Recent labs reviewed and discussed External tests reviewed Prescription drug management: Cardiac meds reviewed; Continue Current medical therapy Schedule: Lexiscan nuclear Stress test Continued/increased exercise recommended Call if symptoms worsen   Orders  No orders of the defined types were placed in this encounter.  No follow-ups on file.  HPI   Erica Day   returns for follow up of Chest discomfort.  She  Was last seen in our office by me on 05/25/22 Her  prior symptoms of Chest Pain  persist, a bit more often. Some dyspnea She did not have the stress test we suggested last visit.   She  has c/o feeling tired all day. She has fallen a few times Her  blood pressure has been controlled Her Cholesterol has  been checked recently Since her last visit she has not been hospitalized  Since her last visit she has not  had changes in medications She does not  exercise regularly.  Physical Examination:  VITAL SIGNS:  Vitals:   11/23/22 1427  BP: 127/71  Pulse: 64  SpO2: 95%  Weight: 56.7 kg (125 lb)  Height: 1.397 m (4' 7)   General: comfortable in NAD Resp:  Resp even and nonlabored.  Lungs sounds clear throughout CV:  S1S2 regular rate and rhythm, 2/6 holosystolic murmur  noted, No gallops or rubs Abd:  Soft,  NABS Ext:  No cyanosis or edema Neck:   No JVD.  No carotid bruit(s) Pulses:  2+ and symmetrical upper and lower extremities.    Past Medical History,  Past Surgical History, Family History, Social History, Medications, Allergies, Social History: As reviewed in EPIC   Review of Systems: All positive and pertinent negatives are noted in the HPI; otherwise all other systems are negative   Objective Data Reviewed During this Patient Encounter:    ECG done 04/06/22 was NSR, NSST-TC, no different from before   Echocardiogram done 07/01/2020 was normal with EF 65-70,  no valvular problems.  Lab Results  Component Value Date   CHOL 117 10/17/2021   TRIG 67 10/17/2021   HDL 51 10/17/2021   LDLDIRECT 52 10/17/2021   Lab Results  Component Value Date   WBC 4.4 07/28/2022   HGB 13.8 07/28/2022   HCT 40.4 07/28/2022   PLT 159 07/28/2022   Lab Results  Component Value Date   NA 144 07/28/2022   K 4.0 07/28/2022   CL 108 07/28/2022   CO2 30 07/28/2022   BUN 32 (H) 07/28/2022   CREATININE 0.88 07/28/2022   AST 34 07/28/2022   ALT 31 07/28/2022      Current Outpatient Medications:  .  conjugated estrogens (Premarin) 0.625 mg/gram vaginal cream, 0.5 grams twice a week at bedtime, Disp: 30 g, Rfl: 3 .  folic acid  (FOLVITE ) 1 mg tablet, Take 1 mg by  mouth Once Daily., Disp: 30 tablet, Rfl: 11 .  gabapentin  (NEURONTIN ) 400 mg capsule, TAKE 1 CAPSULE BY MOUTH AT NIGHT, Disp: 90 capsule, Rfl: 3 .  hydroxychloroquine  (PLAQUENIL ) 200 mg tablet, TAKE 1 TABLET BY MOUTH DAILY, Disp: 90 tablet, Rfl: 0 .  latanoprost  (XALATAN ) 0.005 % ophthalmic solution, , Disp: , Rfl:  .  lidocaine-prilocaine (EMLA) cream, Apply 1 Application topically once as needed., Disp: 30 g, Rfl: 0 .  magnesium  oxide 400 mg (241 mg magnesium ) tab, Take 400 mg by mouth 2 (two) times a day., Disp: , Rfl:  .  meloxicam (MOBIC) 7.5 mg tablet, Take 1 tablet (7.5 mg total) by mouth  daily., Disp: 30 tablet, Rfl: 0 .  metoprolol  tartrate (LOPRESSOR ) 50 mg tablet, TAKE 1 TABLET BY MOUTH TWICE  DAILY, Disp: 180 tablet, Rfl: 3 .  Myrbetriq  50 mg Tb24, TAKE 1 TABLET BY MOUTH DAILY, Disp: 90 tablet, Rfl: 3 .  nystatin (MYCOSTATIN) 100,000 unit/gram ointment, APPLY 1 APPLICATION ON THE SKIN THREE TIMES A DAY FOR TEN DAYS, Disp: , Rfl:  .  nystatin (MYCOSTATIN) cream, Apply  topically 2 (two) times a day., Disp: 30 g, Rfl: 1 .  pantoprazole  (PROTONIX ) 40 mg EC tablet, TAKE 1 TABLET BY MOUTH DAILY, Disp: 90 tablet, Rfl: 3 .  phenazopyridine (PYRIDIUM) 100 mg tablet, Take 100 mg by mouth 3 (three) times a day as needed (pain)., Disp: 10 tablet, Rfl: 0 .  simvastatin  (ZOCOR ) 20 mg tablet, Take 20 mg by mouth nightly., Disp: 90 tablet, Rfl: 3 .  temazepam  (RESTORIL ) 30 mg capsule, TAKE 1 CAPSULE BY MOUTH AT  BEDTIME FOR INSOMNIA, Disp: 90 capsule, Rfl: 0 .  timolol  (TIMOPTIC ) 0.5 % ophthalmic solution, INSTILL 1 DROP INTO EACH EYE IN THE MORNING, Disp: , Rfl:  .  traMADoL  (ULTRAM ) 50 mg tablet, Take 50 mg by mouth every 12 (twelve) hours as needed., Disp: 60 tablet, Rfl: 3 .  travoprost (TRAVATAN Z) 0.004 % drop, Administer 1 drop into each eyes nightly., Disp: , Rfl:  .  valACYclovir (VALTREX) 1 gram tablet, Take 1,000 mg by mouth 3 (three) times a day for 7 days., Disp: 21 tablet, Rfl: 0   Portions of this note were dictated using DRAGON voice recognition software. Please disregard any errors in transcription.  This record has been created using Conservation officer, historic buildings. Errors have been sought and corrected, but may not always be located. Such creation errors do not reflect on the standard of medical care.

## 2023-07-01 ENCOUNTER — Emergency Department (HOSPITAL_COMMUNITY): Payer: Medicare Other

## 2023-07-01 ENCOUNTER — Encounter (HOSPITAL_COMMUNITY): Payer: Self-pay

## 2023-07-01 ENCOUNTER — Inpatient Hospital Stay (HOSPITAL_COMMUNITY)
Admission: EM | Admit: 2023-07-01 | Discharge: 2023-07-06 | DRG: 481 | Disposition: A | Discharge status: Skilled Nursing Facility | Payer: Medicare Other | Attending: Internal Medicine | Admitting: Internal Medicine

## 2023-07-01 ENCOUNTER — Other Ambulatory Visit: Payer: Self-pay

## 2023-07-01 DIAGNOSIS — Z880 Allergy status to penicillin: Secondary | ICD-10-CM | POA: Diagnosis not present

## 2023-07-01 DIAGNOSIS — W19XXXD Unspecified fall, subsequent encounter: Secondary | ICD-10-CM | POA: Diagnosis not present

## 2023-07-01 DIAGNOSIS — R030 Elevated blood-pressure reading, without diagnosis of hypertension: Secondary | ICD-10-CM | POA: Diagnosis present

## 2023-07-01 DIAGNOSIS — Z7901 Long term (current) use of anticoagulants: Secondary | ICD-10-CM

## 2023-07-01 DIAGNOSIS — M79605 Pain in left leg: Secondary | ICD-10-CM

## 2023-07-01 DIAGNOSIS — W010XXA Fall on same level from slipping, tripping and stumbling without subsequent striking against object, initial encounter: Secondary | ICD-10-CM | POA: Diagnosis present

## 2023-07-01 DIAGNOSIS — S728X2A Other fracture of left femur, initial encounter for closed fracture: Secondary | ICD-10-CM | POA: Diagnosis not present

## 2023-07-01 DIAGNOSIS — Z91199 Patient's noncompliance with other medical treatment and regimen due to unspecified reason: Secondary | ICD-10-CM

## 2023-07-01 DIAGNOSIS — K219 Gastro-esophageal reflux disease without esophagitis: Secondary | ICD-10-CM | POA: Diagnosis present

## 2023-07-01 DIAGNOSIS — Y92009 Unspecified place in unspecified non-institutional (private) residence as the place of occurrence of the external cause: Secondary | ICD-10-CM | POA: Diagnosis not present

## 2023-07-01 DIAGNOSIS — R011 Cardiac murmur, unspecified: Secondary | ICD-10-CM | POA: Diagnosis present

## 2023-07-01 DIAGNOSIS — R197 Diarrhea, unspecified: Secondary | ICD-10-CM | POA: Diagnosis present

## 2023-07-01 DIAGNOSIS — Z888 Allergy status to other drugs, medicaments and biological substances status: Secondary | ICD-10-CM | POA: Diagnosis not present

## 2023-07-01 DIAGNOSIS — Z96651 Presence of right artificial knee joint: Secondary | ICD-10-CM | POA: Diagnosis present

## 2023-07-01 DIAGNOSIS — W19XXXA Unspecified fall, initial encounter: Principal | ICD-10-CM | POA: Diagnosis present

## 2023-07-01 DIAGNOSIS — M3500 Sicca syndrome, unspecified: Secondary | ICD-10-CM | POA: Diagnosis present

## 2023-07-01 DIAGNOSIS — D62 Acute posthemorrhagic anemia: Secondary | ICD-10-CM | POA: Diagnosis not present

## 2023-07-01 DIAGNOSIS — Z96653 Presence of artificial knee joint, bilateral: Secondary | ICD-10-CM | POA: Diagnosis present

## 2023-07-01 DIAGNOSIS — R339 Retention of urine, unspecified: Secondary | ICD-10-CM | POA: Diagnosis present

## 2023-07-01 DIAGNOSIS — E785 Hyperlipidemia, unspecified: Secondary | ICD-10-CM | POA: Diagnosis present

## 2023-07-01 DIAGNOSIS — N1832 Chronic kidney disease, stage 3b: Secondary | ICD-10-CM | POA: Diagnosis present

## 2023-07-01 DIAGNOSIS — Z882 Allergy status to sulfonamides status: Secondary | ICD-10-CM | POA: Diagnosis not present

## 2023-07-01 DIAGNOSIS — S0990XA Unspecified injury of head, initial encounter: Secondary | ICD-10-CM | POA: Diagnosis present

## 2023-07-01 DIAGNOSIS — S72492A Other fracture of lower end of left femur, initial encounter for closed fracture: Secondary | ICD-10-CM

## 2023-07-01 DIAGNOSIS — Z79899 Other long term (current) drug therapy: Secondary | ICD-10-CM | POA: Diagnosis not present

## 2023-07-01 DIAGNOSIS — Z8572 Personal history of non-Hodgkin lymphomas: Secondary | ICD-10-CM

## 2023-07-01 DIAGNOSIS — S728X2D Other fracture of left femur, subsequent encounter for closed fracture with routine healing: Secondary | ICD-10-CM | POA: Diagnosis not present

## 2023-07-01 DIAGNOSIS — F05 Delirium due to known physiological condition: Secondary | ICD-10-CM | POA: Diagnosis not present

## 2023-07-01 DIAGNOSIS — R937 Abnormal findings on diagnostic imaging of other parts of musculoskeletal system: Secondary | ICD-10-CM | POA: Diagnosis present

## 2023-07-01 DIAGNOSIS — G4700 Insomnia, unspecified: Secondary | ICD-10-CM | POA: Diagnosis present

## 2023-07-01 DIAGNOSIS — N189 Chronic kidney disease, unspecified: Secondary | ICD-10-CM | POA: Diagnosis not present

## 2023-07-01 DIAGNOSIS — Z9181 History of falling: Secondary | ICD-10-CM

## 2023-07-01 DIAGNOSIS — Z91048 Other nonmedicinal substance allergy status: Secondary | ICD-10-CM

## 2023-07-01 DIAGNOSIS — Z885 Allergy status to narcotic agent status: Secondary | ICD-10-CM | POA: Diagnosis not present

## 2023-07-01 DIAGNOSIS — G629 Polyneuropathy, unspecified: Secondary | ICD-10-CM | POA: Diagnosis present

## 2023-07-01 DIAGNOSIS — M9712XA Periprosthetic fracture around internal prosthetic left knee joint, initial encounter: Secondary | ICD-10-CM | POA: Diagnosis present

## 2023-07-01 DIAGNOSIS — S72452A Displaced supracondylar fracture without intracondylar extension of lower end of left femur, initial encounter for closed fracture: Secondary | ICD-10-CM | POA: Diagnosis present

## 2023-07-01 DIAGNOSIS — R296 Repeated falls: Secondary | ICD-10-CM | POA: Diagnosis present

## 2023-07-01 DIAGNOSIS — Z96649 Presence of unspecified artificial hip joint: Secondary | ICD-10-CM | POA: Diagnosis not present

## 2023-07-01 DIAGNOSIS — I129 Hypertensive chronic kidney disease with stage 1 through stage 4 chronic kidney disease, or unspecified chronic kidney disease: Secondary | ICD-10-CM | POA: Diagnosis present

## 2023-07-01 DIAGNOSIS — M978XXA Periprosthetic fracture around other internal prosthetic joint, initial encounter: Secondary | ICD-10-CM | POA: Diagnosis not present

## 2023-07-01 DIAGNOSIS — D696 Thrombocytopenia, unspecified: Secondary | ICD-10-CM | POA: Diagnosis present

## 2023-07-01 LAB — BASIC METABOLIC PANEL
Anion gap: 10 (ref 5–15)
BUN: 33 mg/dL — ABNORMAL HIGH (ref 8–23)
CO2: 22 mmol/L (ref 22–32)
Calcium: 9 mg/dL (ref 8.9–10.3)
Chloride: 107 mmol/L (ref 98–111)
Creatinine, Ser: 0.93 mg/dL (ref 0.44–1.00)
GFR, Estimated: >60 mL/min (ref >60)
Glucose, Bld: 166 mg/dL — ABNORMAL HIGH (ref 70–99)
Potassium: 4.1 mmol/L (ref 3.5–5.1)
Sodium: 139 mmol/L (ref 135–145)

## 2023-07-01 LAB — MAGNESIUM: Magnesium: 1.8 mg/dL (ref 1.7–2.4)

## 2023-07-01 LAB — ABO/RH: ABO/RH(D): A POS

## 2023-07-01 LAB — HEPATIC FUNCTION PANEL
ALT: 34 U/L (ref 0–44)
AST: 41 U/L (ref 15–41)
Albumin: 3.5 g/dL (ref 3.5–5.0)
Alkaline Phosphatase: 25 U/L — ABNORMAL LOW (ref 38–126)
Bilirubin, Direct: 0.2 mg/dL (ref 0.0–0.2)
Indirect Bilirubin: 0.7 mg/dL (ref 0.3–0.9)
Total Bilirubin: 0.9 mg/dL (ref <1.2)
Total Protein: 5.8 g/dL — ABNORMAL LOW (ref 6.5–8.1)

## 2023-07-01 LAB — PROTIME-INR
INR: 1.1 (ref 0.8–1.2)
Prothrombin Time: 14.3 s (ref 11.4–15.2)

## 2023-07-01 LAB — TSH: TSH: 3.321 u[IU]/mL (ref 0.350–4.500)

## 2023-07-01 LAB — PHOSPHORUS: Phosphorus: 4.2 mg/dL (ref 2.5–4.6)

## 2023-07-01 LAB — CK: Total CK: 179 U/L (ref 38–234)

## 2023-07-01 MED ORDER — POVIDONE-IODINE 10 % EX SWAB
2.0000 | Freq: Once | CUTANEOUS | Status: AC
  Administered 2023-07-02: 2 via TOPICAL

## 2023-07-01 MED ORDER — CHLORHEXIDINE GLUCONATE 4 % EX SOLN
60.0000 mL | Freq: Once | CUTANEOUS | Status: AC
  Administered 2023-07-02: 4 via TOPICAL
  Filled 2023-07-01: qty 60

## 2023-07-01 MED ORDER — TRAMADOL HCL 50 MG PO TABS
50.0000 mg | ORAL_TABLET | Freq: Four times a day (QID) | ORAL | Status: DC | PRN
  Administered 2023-07-01 – 2023-07-02 (×2): 50 mg via ORAL
  Filled 2023-07-01 (×2): qty 1

## 2023-07-01 MED ORDER — TRANEXAMIC ACID-NACL 1000-0.7 MG/100ML-% IV SOLN
1000.0000 mg | INTRAVENOUS | Status: AC
  Administered 2023-07-02: 1000 mg via INTRAVENOUS
  Filled 2023-07-01: qty 100

## 2023-07-01 MED ORDER — ONDANSETRON HCL 4 MG/2ML IJ SOLN
4.0000 mg | Freq: Once | INTRAMUSCULAR | Status: AC
  Administered 2023-07-01: 4 mg via INTRAVENOUS
  Filled 2023-07-01: qty 2

## 2023-07-01 MED ORDER — SIMVASTATIN 20 MG PO TABS
20.0000 mg | ORAL_TABLET | Freq: Every day | ORAL | Status: DC
  Administered 2023-07-01 – 2023-07-05 (×5): 20 mg via ORAL
  Filled 2023-07-01 (×5): qty 1

## 2023-07-01 MED ORDER — CEFAZOLIN SODIUM-DEXTROSE 2-4 GM/100ML-% IV SOLN
2.0000 g | INTRAVENOUS | Status: AC
  Administered 2023-07-02: 2 g via INTRAVENOUS
  Filled 2023-07-01: qty 100

## 2023-07-01 MED ORDER — ONDANSETRON HCL 4 MG/2ML IJ SOLN: 4.0000 mg | Freq: Four times a day (QID) | INTRAMUSCULAR | Status: DC | PRN

## 2023-07-01 MED ORDER — FENTANYL CITRATE PF 50 MCG/ML IJ SOSY
50.0000 ug | PREFILLED_SYRINGE | INTRAMUSCULAR | Status: AC | PRN
  Administered 2023-07-01: 50 ug via INTRAVENOUS
  Filled 2023-07-01: qty 1

## 2023-07-01 MED ORDER — MORPHINE SULFATE (PF) 2 MG/ML IV SOLN
2.0000 mg | INTRAVENOUS | Status: DC | PRN
  Filled 2023-07-01: qty 1

## 2023-07-01 MED ORDER — METHOCARBAMOL 500 MG PO TABS
500.0000 mg | ORAL_TABLET | Freq: Four times a day (QID) | ORAL | Status: DC | PRN
Start: 2023-07-01 — End: 2023-07-07
  Administered 2023-07-05: 500 mg via ORAL
  Filled 2023-07-01 (×2): qty 1

## 2023-07-01 MED ORDER — GABAPENTIN 400 MG PO CAPS
400.0000 mg | ORAL_CAPSULE | Freq: Every day | ORAL | Status: DC
  Administered 2023-07-01 – 2023-07-05 (×5): 400 mg via ORAL
  Filled 2023-07-01 (×5): qty 1

## 2023-07-01 MED ORDER — METOPROLOL TARTRATE 25 MG PO TABS
50.0000 mg | ORAL_TABLET | Freq: Once | ORAL | Status: AC
  Administered 2023-07-01: 25 mg via ORAL
  Filled 2023-07-01: qty 2

## 2023-07-01 MED ORDER — TEMAZEPAM 7.5 MG PO CAPS
30.0000 mg | ORAL_CAPSULE | Freq: Every evening | ORAL | Status: DC | PRN
  Filled 2023-07-01: qty 4

## 2023-07-01 MED ORDER — HYDROCODONE-ACETAMINOPHEN 5-325 MG PO TABS
1.0000 | ORAL_TABLET | Freq: Four times a day (QID) | ORAL | Status: DC | PRN
Start: 2023-07-01 — End: 2023-07-01

## 2023-07-01 MED ORDER — METOPROLOL TARTRATE 50 MG PO TABS
50.0000 mg | ORAL_TABLET | Freq: Two times a day (BID) | ORAL | Status: DC
  Administered 2023-07-02 – 2023-07-06 (×8): 50 mg via ORAL
  Filled 2023-07-01 (×9): qty 1

## 2023-07-01 MED ORDER — PROCHLORPERAZINE EDISYLATE 10 MG/2ML IJ SOLN
5.0000 mg | Freq: Once | INTRAMUSCULAR | Status: AC
  Administered 2023-07-01: 5 mg via INTRAVENOUS
  Filled 2023-07-01: qty 2

## 2023-07-01 MED ORDER — METHOCARBAMOL 1000 MG/10ML IJ SOLN: 500.0000 mg | Freq: Four times a day (QID) | INTRAMUSCULAR | Status: DC | PRN

## 2023-07-01 MED ORDER — FENTANYL CITRATE PF 50 MCG/ML IJ SOSY
PREFILLED_SYRINGE | INTRAMUSCULAR | Status: AC
  Administered 2023-07-01: 50 ug
  Filled 2023-07-01: qty 1

## 2023-07-01 MED ORDER — METOPROLOL TARTRATE 25 MG PO TABS
25.0000 mg | ORAL_TABLET | Freq: Once | ORAL | Status: AC
  Administered 2023-07-01: 25 mg via ORAL
  Filled 2023-07-01: qty 1

## 2023-07-01 NOTE — ED Notes (Signed)
ED TO INPATIENT HANDOFF REPORT  ED Nurse Name and Phone #:  Erica Day [phone]  S Name/Age/Gender Erica Day 82 y.o. female Room/Bed: 029C/029C  Code Status   Code Status: Full Code  Home/SNF/Other Home Patient oriented to: self, place, time, and situation Is this baseline? Yes   Triage Complete: Triage complete  Chief Complaint Other fracture of left femur, initial encounter for closed fracture [name]) [S72.8X2A]  Triage Note PT BIB [facility] after c/o of a fall at home.PT was walking with her walker and fell ground level on her left side. PT has swelling and pain in her left leg only. NO LOC and PT is not on blood thinners. PT received a total of of fentanyl before arrival.   Allergies Allergies  Allergen Reactions   Benylin Cough [Diphenhydramine] Anaphylaxis and Shortness Of Breath   Codeine Nausea And Vomiting   Tape Other (See Comments)    Pt states makes her skin burn  Transparent dressing causes a rash per patient - Please use mepilex   Aloe Vera Rash    Turn to sores   Penicillins Rash    Has patient had a PCN reaction causing immediate rash, facial/tongue/throat swelling, SOB or lightheadedness with hypotension: No Has patient had a PCN reaction causing severe rash involving mucus membranes or skin necrosis: No Has patient had a PCN reaction that required hospitalization: No Has patient had a PCN reaction occurring within the last 10 years: Yes If all of the above answers are "NO", then may proceed with Cephalosporin use.   Sertraline Anxiety   Sulfa Antibiotics Rash    Level of Care/Admitting Diagnosis ED Disposition     ED Disposition  Admit   Condition  --   Comment  Hospital Area: [facility] [[id number]]  Level of Care: Telemetry Medical [[id number]]  May admit patient to [facility] or [facility] if equivalent level of care is available:: No  Covid Evaluation: Asymptomatic - no recent exposure (last 10 days) testing not required   Diagnosis: Other fracture of left femur, initial encounter for closed fracture [facility]) [[id number]]  Admitting Physician: Erica Day [[id number]]  Attending Physician: Erica Day [[id number]]  Certification:: I certify this patient will need inpatient services for at least 2 midnights  Expected Medical Readiness: 07/04/2023          B Medical/Surgery History Past Medical History:  Diagnosis Date   Arthritis    C. difficile colitis 01/2018   Depression    GERD (gastroesophageal reflux disease)    Hypertension    Lymphoma (HCC)    Sjogren's syndrome (HCC)    UTI (urinary tract infection) 01/2018   Past Surgical History:  Procedure Laterality Date   BREAST REDUCTION SURGERY     CHOLECYSTECTOMY     FOOT SURGERY     REPLACEMENT TOTAL KNEE BILATERAL     SHOULDER SURGERY     TONSILLECTOMY     TOTAL ABDOMINAL HYSTERECTOMY       A IV Location/Drains/Wounds Patient Lines/Drains/Airways Status     Active Line/Drains/Airways     Name Placement date Placement time Site Days   Peripheral IV 07/01/23 20 G Right Antecubital 07/01/23  1748  Antecubital  less than 1   Wound / Incision (Open or Dehisced) 02/07/18 Other (Comment) Knee Right 02/07/18  1731  Knee  1970            Intake/Output Last 24 hours No intake or output data in the 24 hours ending 07/01/23 2221  Labs/Imaging  Results for orders placed or performed during the hospital encounter of 07/01/23 (from the past 48 hour(s))  Basic metabolic panel     Status: Abnormal   Collection Time: 07/01/23  8:38 PM  Result Value Ref Range   Sodium 139 135 - 145 mmol/L   Potassium 4.1 3.5 - 5.1 mmol/L   Chloride 107 98 - 111 mmol/L   CO2 22 22 - 32 mmol/L   Glucose, Bld 166 (H) 70 - 99 mg/dL    Comment: Glucose reference range applies only to samples taken after fasting for at least 8 hours.   BUN 33 (H) 8 - 23 mg/dL   Creatinine, Ser 7.84 0.44 - 1.00 mg/dL   Calcium 9.0 8.9 - 69.6 mg/dL   GFR, Estimated >29 >52 mL/min     Comment: (NOTE) Calculated using the CKD-EPI Creatinine Equation (2021)    Anion gap 10 5 - 15    Comment: Performed at [facility], [address], [place], [place] [address]   CT Head Wo Contrast  Result Date: 07/01/2023 CLINICAL DATA:  Head trauma, minor (Age >= 65y); Neck trauma (Age >= 65y) EXAM: CT HEAD WITHOUT CONTRAST CT CERVICAL SPINE WITHOUT CONTRAST TECHNIQUE: Multidetector CT imaging of the head and cervical spine was performed following the standard protocol without intravenous contrast. Multiplanar CT image reconstructions of the cervical spine were also generated. RADIATION DOSE REDUCTION: This exam was performed according to the departmental dose-optimization program which includes automated exposure control, adjustment of the mA and/or kV according to patient size and/or use of iterative reconstruction technique. COMPARISON:  None Available. FINDINGS: CT HEAD FINDINGS Brain: No hemorrhage. No hydrocephalus. No extra-axial fluid collection. No CT evidence of an acute cortical infarct. No mass effect. No mass lesion. There is sequela of moderate overall chronic microvascular ischemic change. 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 Skull: Normal. Negative for fracture or focal lesion. Sinuses/Orbits: No middle ear or mastoid effusion. Paranasal sinuses are clear. Bilateral lens replacement. Orbits are otherwise unremarkable. Other: None. CT CERVICAL SPINE FINDINGS Alignment: Grade 1 anterolisthesis of C5 on C6. Skull base and vertebrae: No acute fracture. There is a nonspecific hyperdense lesion in the C5 vertebral body level. Recommend comparison with prior imaging to exclude the possibility of osseous metastatic disease Soft tissues and spinal canal: No prevertebral fluid or swelling. No visible canal hematoma. Disc levels:  No evidence of high-grade spinal canal stenosis. Upper chest: Negative. Other:  None IMPRESSION: 1. No acute intracranial abnormality. 2. No acute fracture or traumatic subluxation of the cervical spine. 3. Nonspecific hyperdense lesion in the C5 vertebral body level. Recommend comparison with prior imaging to exclude the possibility of osseous metastatic disease. Electronically Signed   By: Lorenza Cambridge M.D.   On: 07/01/2023 19:36   CT Cervical Spine Wo Contrast  Result Date: 07/01/2023 CLINICAL DATA:  Head trauma, minor (Age >= 65y); Neck trauma (Age >= 65y) EXAM: CT HEAD WITHOUT CONTRAST CT CERVICAL SPINE WITHOUT CONTRAST TECHNIQUE: Multidetector CT imaging of the head and cervical spine was performed following the standard protocol without intravenous contrast. Multiplanar CT image reconstructions of the cervical spine were also generated. RADIATION DOSE REDUCTION: This exam was performed according to the departmental dose-optimization program which includes automated exposure control, adjustment of the mA and/or kV according to patient size and/or use of iterative reconstruction technique. COMPARISON:  None Available. FINDINGS: CT HEAD FINDINGS Brain: No hemorrhage. No hydrocephalus. No extra-axial fluid collection.  No CT evidence of an acute cortical infarct. No mass effect. No mass lesion. There is sequela of moderate overall chronic microvascular ischemic change. 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 Skull: Normal. Negative for fracture or focal lesion. Sinuses/Orbits: No middle ear or mastoid effusion. Paranasal sinuses are clear. Bilateral lens replacement. Orbits are otherwise unremarkable. Other: None. CT CERVICAL SPINE FINDINGS Alignment: Grade 1 anterolisthesis of C5 on C6. Skull base and vertebrae: No acute fracture. There is a nonspecific hyperdense lesion in the C5 vertebral body level. Recommend comparison with prior imaging to exclude the possibility of osseous metastatic disease Soft  tissues and spinal canal: No prevertebral fluid or swelling. No visible canal hematoma. Disc levels:  No evidence of high-grade spinal canal stenosis. Upper chest: Negative. Other: None IMPRESSION: 1. No acute intracranial abnormality. 2. No acute fracture or traumatic subluxation of the cervical spine. 3. Nonspecific hyperdense lesion in the C5 vertebral body level. Recommend comparison with prior imaging to exclude the possibility of osseous metastatic disease. Electronically Signed   By: Lorenza Cambridge M.D.   On: 07/01/2023 19:36   DG Tibia/Fibula Left  Result Date: 07/01/2023 CLINICAL DATA:  Pain after fall. EXAM: LEFT TIBIA AND FIBULA - 2 VIEW COMPARISON:  None Available. FINDINGS: No evidence of lower leg fracture. Knee arthroplasty is intact were visualized. Distal femur fracture is partially included in the field of view. Ankle alignment is intact, distal most tibia and fibula not included in the AP view. Prominent vascular calcifications. IMPRESSION: No lower leg fracture. Electronically Signed   By: Narda Rutherford M.D.   On: 07/01/2023 19:33   DG Femur Min 2 Views Left  Result Date: 07/01/2023 CLINICAL DATA:  Left femoral deformity after fall, pain distally. EXAM: LEFT FEMUR 2 VIEWS COMPARISON:  None Available. FINDINGS: Comminuted, angulated and displaced distal femur fracture. No convincing fracture extension to the knee arthroplasty. Femoral shaft and proximal femur are intact. Advanced vascular calcifications. IMPRESSION: Comminuted, angulated and displaced distal femur fracture. No convincing involvement of the knee arthroplasty Electronically Signed   By: Narda Rutherford M.D.   On: 07/01/2023 19:32   DG Pelvis 1-2 Views  Result Date: 07/01/2023 CLINICAL DATA:  Pain after fall. EXAM: PELVIS - 1-2 VIEW COMPARISON:  None Available. FINDINGS: The bones are subjectively under mineralized. The cortical margins of the bony pelvis are intact. No fracture. Pubic symphysis and sacroiliac  joints are congruent. Both femoral heads are well-seated in the respective acetabula. No hip dislocation. Mild bilateral hip osteoarthritis. Prominent vascular calcifications. IMPRESSION: 1. No pelvic fracture. 2. Bilateral hip osteoarthritis. Electronically Signed   By: Narda Rutherford M.D.   On: 07/01/2023 19:31    Pending Labs Unresulted Labs (From admission, onward)     Start     Ordered   07/02/23 0500  Prealbumin  Tomorrow morning,   R        07/01/23 2120   07/01/23 2144  VITAMIN D 25 Hydroxy (Vit-D Deficiency, Fractures)  Once,   URGENT        07/01/23 2143   07/01/23 2121  Hepatic function panel  Add-on,   AD       Question:  Release to patient  Answer:  Immediate   07/01/23 2120   07/01/23 2121  CK  Add-on,   AD        07/01/23 2120   07/01/23 2121  Magnesium  Add-on,   AD  07/01/23 2120   07/01/23 2121  Phosphorus  Add-on,   AD        07/01/23 2120   07/01/23 2121  TSH  Add-on,   AD        07/01/23 2120   07/01/23 2121  Protime-INR  Once,   STAT       Question:  Release to patient  Answer:  Immediate   07/01/23 2120   07/01/23 2121  Type and screen [facility]  Once,   STAT       Comments: [facility]    07/01/23 2120   07/01/23 2110  CBC with Differential/Platelet  Once,   R        07/01/23 2110   07/01/23 1808  CBC with Differential  Once,   STAT        07/01/23 1808            Vitals/Pain Today's Vitals   07/01/23 1748 07/01/23 1959 07/01/23 2000 07/01/23 2002  BP: (!) 175/87  (!) 170/88   Pulse: 62  67   Resp: 14  18   Temp: 98.2 F (36.8 C)     TempSrc: Oral     SpO2: 97%  97%   Weight:      Height:      PainSc:  7   7     Isolation Precautions No active isolations  Medications Medications  ondansetron (ZOFRAN) injection 4 mg (has no administration in time range)  ondansetron (ZOFRAN) injection 4 mg (4 mg Intravenous Given 07/01/23 1954)  metoprolol tartrate (LOPRESSOR) tablet 50 mg (25 mg Oral Given  07/01/23 2000)  fentaNYL (SUBLIMAZE) injection 50 mcg (50 mcg Intravenous Given 07/01/23 1900)  metoprolol tartrate (LOPRESSOR) tablet 25 mg (25 mg Oral Given 07/01/23 1954)  fentaNYL (SUBLIMAZE) 50 MCG/ML injection (50 mcg  Given 07/01/23 2125)    Mobility walks     Focused Assessments Cardiac Assessment Handoff:    No results found for: "CKTOTAL", "CKMB", "CKMBINDEX", "TROPONINI" No results found for: "DDIMER" Does the Patient currently have chest pain? No    R Recommendations: See Admitting Provider Note  Report given to:   Additional Notes:

## 2023-07-01 NOTE — Assessment & Plan Note (Addendum)
-   management as per orthopedics,  plan to operate   in  a.m.   Keep nothing by mouth post midnight. 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

## 2023-07-01 NOTE — Subjective & Objective (Signed)
Pt comes in with a fall at home was walking with walker and fell onto left side Left leg swelling and pain  Not on blood thinners Has hx of multiple falls  Reported head injury  No LOC  No fever n o chills  No CP now no SOB

## 2023-07-01 NOTE — Assessment & Plan Note (Signed)
Allow permissive htn ?

## 2023-07-01 NOTE — Assessment & Plan Note (Signed)
Will need PT OT assessment prior to discharge  

## 2023-07-01 NOTE — Assessment & Plan Note (Signed)
Radiology noted possible lesion could be secondary to metastatic disease.  Patient has known history of lymphoma although there will be less likely she has been actively followed by oncology discussed at length with family recommended follow-up closely with oncology patient may need repeat PET scan

## 2023-07-01 NOTE — Progress Notes (Signed)
Case dw EDP.  Will need ORIF lf left distal femur.  Plan for tomorrow with Ortho traumatologist, Dr. [name].  Please keep NPO at MN.  Full consult to come tomorrow.

## 2023-07-01 NOTE — H&P (Signed)
Erica Day UJW:[id number] DOB: [birth year] DOA: 07/01/2023     PCP: Darrick Grinder, [facility]  Andreas Blower, MD   Outpatient Specialists:   CARDS: Arnette Schaumann, MD    NEphrology  Dr. Lisette Abu,  Rheumatology Dr.Ziolkowska, Leanord Hawking, MD     Oncology  Irena Cords, MD      [facility] oncology service.   Patient arrived to ER on 07/01/23 at 1735 Referred by Attending Franne Forts, DO   Patient coming from:    home Lives  With family     Chief Complaint:   Chief Complaint  Patient presents with   Fall    HPI: Erica Day is a 82 y.o. female with medical history significant of  Diffuse B cell lymphoma, Sjogren syndrome, anemia, h xo f C.dif, GERD, HTN    Sp bilateral knees replacements, neuropathy  Presented with fall Pt comes in with a fall at home was walking with walker and fell onto left side Left leg swelling and pain  Not on blood thinners Has hx of multiple falls  Reported head injury  No LOC  No fever n o chills  No CP now no SOB  No CP with exertion At baseline not able to walk a block or a flight of stares due to joint pain   Of note she had stress test in May 2024 that was low risk  Denies significant ETOH intake   Does not smoke   No results found for: "SARSCOV2NAA"      Regarding pertinent Chronic problems:     Hyperlipidemia -  on statins Zocor    HTN on metoprolol,    Sjogren syndrome - on plaquenil  While in ER:        Lab Orders         CBC with Differential         Basic metabolic panel         CBC with Differential/Platelet      CT HEAD   NON acute  CT neck . Nonspecific hyperdense lesion in the C5 vertebral body level. Recommend comparison with prior imaging to exclude the possibility of osseous metastatic disease.     CXR - ordered    Following Medications were ordered in ER: Medications  ondansetron (ZOFRAN) injection 4 mg (4 mg Intravenous Given 07/01/23 1954)   metoprolol tartrate (LOPRESSOR) tablet 50 mg (25 mg Oral Given 07/01/23 2000)  fentaNYL (SUBLIMAZE) injection 50 mcg (50 mcg Intravenous Given 07/01/23 1900)  metoprolol tartrate (LOPRESSOR) tablet 25 mg (25 mg Oral Given 07/01/23 1954)    _______________________________________________________ ER Provider Called:     Ortho  Dr Aundria Rud They Recommend admit to medicine  NPO after midnight Will see in AM      ED Triage Vitals  Encounter Vitals Group     BP 07/01/23 1745 (!) 175/87     Systolic BP Percentile --      Diastolic BP Percentile --      Pulse Rate 07/01/23 1748 62     Resp 07/01/23 1748 14     Temp 07/01/23 1748 98.2 F (36.8 C)     Temp Source 07/01/23 1748 Oral     SpO2 07/01/23 1748 97 %     Weight 07/01/23 1745 130 lb 1.1 oz (59 kg)     Height 07/01/23 1745 5\' 7"  (1.702 m)     Head Circumference --      Peak Flow --  Pain Score 07/01/23 1745 8     Pain Loc --      Pain Education --      Exclude from Growth Chart --   WGNF(62)@     _________________________________________ Significant initial  Findings: Abnormal Labs Reviewed  BASIC METABOLIC PANEL - Abnormal; Notable for the following components:      Result Value   Glucose, Bld 166 (*)    BUN 33 (*)    All other components within normal limits     ECG: Ordered Personally reviewed and interpreted by me showing: HR : 64 Rhythm:  Sinus rhythm Probable left ventricular hypertrophy Nonspecific T abnormalities, lateral leads QTC 458    The recent clinical data is shown below. Vitals:   07/01/23 1745 07/01/23 1748 07/01/23 2000  BP: (!) 175/87 (!) 175/87 (!) 170/88  Pulse:  62 67  Resp:  14 18  Temp:  98.2 F (36.8 C)   TempSrc:  Oral   SpO2:  97% 97%  Weight: 59 kg    Height: 5\' 7"  (1.702 m)      WBC     Component Value Date/Time   WBC 9.9 02/16/2020 1238   LYMPHSABS 0.3 (L) 02/09/2018 0447   MONOABS 0.4 02/09/2018 0447   EOSABS 0.1 02/09/2018 0447   BASOSABS 0.0 02/09/2018 0447       UA  ordered     Results for orders placed or performed during the hospital encounter of 02/07/18  Urine culture     Status: Abnormal   Collection Time: 02/07/18  9:58 AM   Specimen: Urine, Clean Catch  Result Value Ref Range Status   Specimen Description URINE, CLEAN CATCH  Final   Special Requests   Final    NONE Performed at [facility], [address] [place], [place] [address]    Culture (A)  Final    80,000 COLONIES/mL ESCHERICHIA COLI Confirmed Extended Spectrum Beta-Lactamase Producer (ESBL).  In bloodstream infections from ESBL organisms, carbapenems are preferred over piperacillin/tazobactam. They are shown to have a lower risk of mortality. 30,000 COLONIES/mL ENTEROCOCCUS AVIUM    Report Status 02/10/2018 FINAL  Final   Organism ID, Bacteria ESCHERICHIA COLI (A)  Final   Organism ID, Bacteria ENTEROCOCCUS AVIUM (A)  Final      Susceptibility   Enterococcus avium - MIC*    AMPICILLIN <=2 SENSITIVE Sensitive     LEVOFLOXACIN 2 SENSITIVE Sensitive     NITROFURANTOIN 64 INTERMEDIATE Intermediate     VANCOMYCIN >=32 RESISTANT Resistant     LINEZOLID 2 SENSITIVE Sensitive     * 30,000 COLONIES/mL ENTEROCOCCUS AVIUM   Escherichia coli - MIC*    AMPICILLIN >=32 RESISTANT Resistant     CEFAZOLIN >=64 RESISTANT Resistant     CEFTRIAXONE >=64 RESISTANT Resistant     CIPROFLOXACIN >=4 RESISTANT Resistant     GENTAMICIN <=1 SENSITIVE Sensitive     IMIPENEM <=0.25 SENSITIVE Sensitive     NITROFURANTOIN <=16 SENSITIVE Sensitive     TRIMETH/SULFA >=320 RESISTANT Resistant     AMPICILLIN/SULBACTAM 8 SENSITIVE Sensitive     PIP/TAZO <=4 SENSITIVE Sensitive     Extended ESBL POSITIVE Resistant     * 80,000 COLONIES/mL ESCHERICHIA COLI  Culture, blood (Routine X 2) w Reflex to ID Panel     Status: None   Collection Time: 02/07/18 12:34 PM   Specimen: BLOOD  Result Value Ref Range Status   Specimen Description BLOOD LEFT ANTECUBITAL  Final   Special Requests   Final  BOTTLES DRAWN AEROBIC AND ANAEROBIC Blood Culture adequate volume   Culture   Final    NO GROWTH 5 DAYS Performed at [facility], [address], [place], [place] [address]    Report Status 02/12/2018 FINAL  Final  Culture, blood (Routine X 2) w Reflex to ID Panel     Status: None   Collection Time: 02/07/18 12:51 PM   Specimen: BLOOD  Result Value Ref Range Status   Specimen Description BLOOD RIGHT ANTECUBITAL  Final   Special Requests   Final    BOTTLES DRAWN AEROBIC AND ANAEROBIC Blood Culture adequate volume   Culture   Final    NO GROWTH 5 DAYS Performed at [facility], [address], [place], [place] [address]    Report Status 02/12/2018 FINAL  Final       __________________________________________________________ Recent Labs  Lab 07/01/23 2038  NA 139  K 4.1  CO2 22  GLUCOSE 166*  BUN 33*  CREATININE 0.93  CALCIUM 9.0    Cr  stable,    Lab Results  Component Value Date   CREATININE 0.93 07/01/2023   CREATININE 1.48 (H) 02/16/2020   CREATININE 0.95 02/09/2018    Recent Labs  Lab 07/01/23 2212  AST 41  ALT 34  ALKPHOS 25*  BILITOT 0.9  PROT 5.8*  ALBUMIN 3.5   Lab Results  Component Value Date   CALCIUM 9.0 07/01/2023      _______________________________________________ Hospitalist was called for admission for   Fall,   Left leg pain  Closed comminuted intra-articular fracture of distal end of left femur, initial encounter (HCC)    The following Work up has been ordered so far:  Orders Placed This Encounter  Procedures   DG Femur Min 2 Views Left   CT Head Wo Contrast   CT Cervical Spine Wo Contrast   DG Tibia/Fibula Left   DG Pelvis 1-2 Views   CBC with Differential   Basic metabolic panel   CBC with Differential/Platelet   Diet NPO time specified   Apply bucks traction   Consult to orthopedic surgery   Consult to hospitalist     OTHER Significant initial  Findings:  labs showing:     DM  labs:  HbA1C: No  results for input(s): "HGBA1C" in the last 8760 hours.     CBG (last 3)  No results for input(s): "GLUCAP" in the last 72 hours.        Cultures:    Component Value Date/Time   SDES BLOOD RIGHT ANTECUBITAL 02/07/2018 1251   SPECREQUEST  02/07/2018 1251    BOTTLES DRAWN AEROBIC AND ANAEROBIC Blood Culture adequate volume   CULT  02/07/2018 1251    NO GROWTH 5 DAYS Performed at [facility], [address] [place], [place] [address]    REPTSTATUS 02/12/2018 FINAL 02/07/2018 1251     Radiological Exams on Admission: CT Head Wo Contrast  Result Date: 07/01/2023 CLINICAL DATA:  Head trauma, minor (Age >= 65y); Neck trauma (Age >= 65y) EXAM: CT HEAD WITHOUT CONTRAST CT CERVICAL SPINE WITHOUT CONTRAST TECHNIQUE: Multidetector CT imaging of the head and cervical spine was performed following the standard protocol without intravenous contrast. Multiplanar CT image reconstructions of the cervical spine were also generated. RADIATION DOSE REDUCTION: This exam was performed according to the departmental dose-optimization program which includes automated exposure control, adjustment of the mA and/or kV according to patient size and/or use of iterative reconstruction technique. COMPARISON:  None Available.  FINDINGS: CT HEAD FINDINGS Brain: No hemorrhage. No hydrocephalus. No extra-axial fluid collection. No CT evidence of an acute cortical infarct. No mass effect. No mass lesion. There is sequela of moderate overall chronic microvascular ischemic change. 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 Skull: Normal. Negative for fracture or focal lesion. Sinuses/Orbits: No middle ear or mastoid effusion. Paranasal sinuses are clear. Bilateral lens replacement. Orbits are otherwise unremarkable. Other: None. CT CERVICAL SPINE FINDINGS Alignment: Grade 1 anterolisthesis of C5 on C6. Skull base and vertebrae: No acute fracture.  There is a nonspecific hyperdense lesion in the C5 vertebral body level. Recommend comparison with prior imaging to exclude the possibility of osseous metastatic disease Soft tissues and spinal canal: No prevertebral fluid or swelling. No visible canal hematoma. Disc levels:  No evidence of high-grade spinal canal stenosis. Upper chest: Negative. Other: None IMPRESSION: 1. No acute intracranial abnormality. 2. No acute fracture or traumatic subluxation of the cervical spine. 3. Nonspecific hyperdense lesion in the C5 vertebral body level. Recommend comparison with prior imaging to exclude the possibility of osseous metastatic disease. Electronically Signed   By: Lorenza Cambridge M.D.   On: 07/01/2023 19:36   CT Cervical Spine Wo Contrast  Result Date: 07/01/2023 CLINICAL DATA:  Head trauma, minor (Age >= 65y); Neck trauma (Age >= 65y) EXAM: CT HEAD WITHOUT CONTRAST CT CERVICAL SPINE WITHOUT CONTRAST TECHNIQUE: Multidetector CT imaging of the head and cervical spine was performed following the standard protocol without intravenous contrast. Multiplanar CT image reconstructions of the cervical spine were also generated. RADIATION DOSE REDUCTION: This exam was performed according to the departmental dose-optimization program which includes automated exposure control, adjustment of the mA and/or kV according to patient size and/or use of iterative reconstruction technique. COMPARISON:  None Available. FINDINGS: CT HEAD FINDINGS Brain: No hemorrhage. No hydrocephalus. No extra-axial fluid collection. No CT evidence of an acute cortical infarct. No mass effect. No mass lesion. There is sequela of moderate overall chronic microvascular ischemic change. 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 Skull: Normal. Negative for fracture or focal lesion. Sinuses/Orbits: No middle ear or mastoid effusion. Paranasal sinuses are clear. Bilateral lens  replacement. Orbits are otherwise unremarkable. Other: None. CT CERVICAL SPINE FINDINGS Alignment: Grade 1 anterolisthesis of C5 on C6. Skull base and vertebrae: No acute fracture. There is a nonspecific hyperdense lesion in the C5 vertebral body level. Recommend comparison with prior imaging to exclude the possibility of osseous metastatic disease Soft tissues and spinal canal: No prevertebral fluid or swelling. No visible canal hematoma. Disc levels:  No evidence of high-grade spinal canal stenosis. Upper chest: Negative. Other: None IMPRESSION: 1. No acute intracranial abnormality. 2. No acute fracture or traumatic subluxation of the cervical spine. 3. Nonspecific hyperdense lesion in the C5 vertebral body level. Recommend comparison with prior imaging to exclude the possibility of osseous metastatic disease. Electronically Signed   By: Lorenza Cambridge M.D.   On: 07/01/2023 19:36   DG Tibia/Fibula Left  Result Date: 07/01/2023 CLINICAL DATA:  Pain after fall. EXAM: LEFT TIBIA AND FIBULA - 2 VIEW COMPARISON:  None Available. FINDINGS: No evidence of lower leg fracture. Knee arthroplasty is intact were visualized. Distal femur fracture is partially included in the field of view. Ankle alignment is intact, distal most tibia and fibula not included in the AP view. Prominent vascular calcifications. IMPRESSION: No lower leg fracture. Electronically Signed   By:  Narda Rutherford M.D.   On: 07/01/2023 19:33   DG Femur Min 2 Views Left  Result Date: 07/01/2023 CLINICAL DATA:  Left femoral deformity after fall, pain distally. EXAM: LEFT FEMUR 2 VIEWS COMPARISON:  None Available. FINDINGS: Comminuted, angulated and displaced distal femur fracture. No convincing fracture extension to the knee arthroplasty. Femoral shaft and proximal femur are intact. Advanced vascular calcifications. IMPRESSION: Comminuted, angulated and displaced distal femur fracture. No convincing involvement of the knee arthroplasty  Electronically Signed   By: Narda Rutherford M.D.   On: 07/01/2023 19:32   DG Pelvis 1-2 Views  Result Date: 07/01/2023 CLINICAL DATA:  Pain after fall. EXAM: PELVIS - 1-2 VIEW COMPARISON:  None Available. FINDINGS: The bones are subjectively under mineralized. The cortical margins of the bony pelvis are intact. No fracture. Pubic symphysis and sacroiliac joints are congruent. Both femoral heads are well-seated in the respective acetabula. No hip dislocation. Mild bilateral hip osteoarthritis. Prominent vascular calcifications. IMPRESSION: 1. No pelvic fracture. 2. Bilateral hip osteoarthritis. Electronically Signed   By: Narda Rutherford M.D.   On: 07/01/2023 19:31   _______________________________________________________________________________________________________ Latest  Blood pressure (!) 170/88, pulse 67, temperature 98.2 F (36.8 C), temperature source Oral, resp. rate 18, height 5\' 7"  (1.702 m), weight 59 kg, SpO2 97%.   Vitals  labs and radiology finding personally reviewed  Review of Systems:    Pertinent positives include:  leg pain   Constitutional:  No weight loss, night sweats, Fevers, chills, fatigue, weight loss  HEENT:  No headaches, Difficulty swallowing,Tooth/dental problems,Sore throat,  No sneezing, itching, ear ache, nasal congestion, post nasal drip,  Cardio-vascular:  No chest pain, Orthopnea, PND, anasarca, dizziness, palpitations.no Bilateral lower extremity swelling  GI:  No heartburn, indigestion, abdominal pain, nausea, vomiting, diarrhea, change in bowel habits, loss of appetite, melena, blood in stool, hematemesis Resp:  no shortness of breath at rest. No dyspnea on exertion, No excess mucus, no productive cough, No non-productive cough, No coughing up of blood.No change in color of mucus.No wheezing. Skin:  no rash or lesions. No jaundice GU:  no dysuria, change in color of urine, no urgency or frequency. No straining to urinate.  No flank pain.   Musculoskeletal:  No joint pain or no joint swelling. No decreased range of motion. No back pain.  Psych:  No change in mood or affect. No depression or anxiety. No memory loss.  Neuro: no localizing neurological complaints, no tingling, no weakness, no double vision, no gait abnormality, no slurred speech, no confusion  All systems reviewed and apart from HOPI all are negative _______________________________________________________________________________________________ Past Medical History:   Past Medical History:  Diagnosis Date   Arthritis    C. difficile colitis 01/2018   Depression    GERD (gastroesophageal reflux disease)    Hypertension    Lymphoma (HCC)    Sjogren's syndrome (HCC)    UTI (urinary tract infection) 01/2018     Past Surgical History:  Procedure Laterality Date   BREAST REDUCTION SURGERY     CHOLECYSTECTOMY     FOOT SURGERY     REPLACEMENT TOTAL KNEE BILATERAL     SHOULDER SURGERY     TONSILLECTOMY     TOTAL ABDOMINAL HYSTERECTOMY      Social History:  Ambulatory   walker    reports that she has never smoked. She has never used smokeless tobacco. She reports that she does not drink alcohol and does not use drugs.   Family History:   History reviewed. No  pertinent family history. ______________________________________________________________________________________________ Allergies: Allergies  Allergen Reactions   Benylin Cough [Diphenhydramine] Anaphylaxis and Shortness Of Breath   Codeine Nausea And Vomiting   Tape Other (See Comments)    Pt states makes her skin burn  Transparent dressing causes a rash per patient - Please use mepilex   Aloe Vera Rash    Turn to sores   Penicillins Rash    Has patient had a PCN reaction causing immediate rash, facial/tongue/throat swelling, SOB or lightheadedness with hypotension: No Has patient had a PCN reaction causing severe rash involving mucus membranes or skin necrosis: No Has patient had a PCN  reaction that required hospitalization: No Has patient had a PCN reaction occurring within the last 10 years: Yes If all of the above answers are "NO", then may proceed with Cephalosporin use.   Sertraline Anxiety   Sulfa Antibiotics Rash     Prior to Admission medications   Medication Sig Start Date End Date Taking? Authorizing Provider  docusate sodium (COLACE) 100 MG capsule Take 100 mg by mouth as needed for mild constipation.    [provider]  folic acid (FOLVITE) 1 MG tablet Take 1 mg by mouth daily. 04/20/17   [provider]  gabapentin (NEURONTIN) 400 MG capsule Take 400 mg by mouth at bedtime. 12/02/17   [provider]  lidocaine-prilocaine (EMLA) cream Apply 1 application topically as needed (port).     [provider]  metoprolol tartrate (LOPRESSOR) 50 MG tablet Take 50 mg by mouth 2 (two) times daily. 12/08/17   [provider]  mirabegron ER (MYRBETRIQ) 50 MG TB24 tablet Take 50 mg by mouth at bedtime.    [provider]  Multiple Vitamin (MULTIVITAMIN) capsule Take 1 capsule by mouth daily.    [provider]  nystatin (MYCOSTATIN/NYSTOP) powder Apply 1 application topically 2 (two) times daily as needed (under breast and back).     [provider]  ondansetron (ZOFRAN) 8 MG tablet Take 8 mg by mouth every 8 (eight) hours as needed for nausea or vomiting.  02/07/18   [provider]  potassium chloride SA (K-DUR,KLOR-CON) 20 MEQ tablet Take 20 mEq by mouth every other day.    [provider]  ranitidine (ZANTAC) 150 MG tablet Take 150 mg by mouth 2 (two) times daily. 12/01/17   [provider]  simvastatin (ZOCOR) 20 MG tablet Take 20 mg by mouth at bedtime. 12/08/17   [provider]  Tbo-Filgrastim (GRANIX) 480 MCG/0.8ML SOSY injection Inject 0.8 mLs (480 mcg total) into the skin daily at 6 PM. 02/08/18   Elgergawy, Leana Roe, MD  temazepam (RESTORIL) 30 MG capsule Take 30 mg  by mouth at bedtime as needed for sleep.  12/01/17 12/01/18  [provider]  timolol (BETIMOL) 0.5 % ophthalmic solution Place 1 drop into both eyes daily.    [provider]  Travoprost, BAK Free, (TRAVATAN) 0.004 % SOLN ophthalmic solution Place 1 drop into both eyes at bedtime.    [provider]  vancomycin (VANCOCIN) 125 MG capsule Take 125 mg by mouth 3 (three) times daily.  02/04/18   [provider]    ___________________________________________________________________________________________________ Physical Exam:    07/01/2023    8:00 PM 07/01/2023    5:48 PM 07/01/2023    5:45 PM  Vitals with BMI  Height   5\' 7"   Weight   130 lbs 1 oz  BMI   20.37  Systolic 170 175 161  Diastolic 88 87  87  Pulse 67 62      1. General:  in No  Acute distress   Chronically ill   -appearing 2. Psychological: Alert and   Oriented 3. Head/ENT:   Dry Mucous Membranes                          Head Non traumatic, neck supple                          Poor Dentition 4. SKIN:  decreased Skin turgor,  Skin clean Dry and intact no rash    5. Heart: Regular rate and rhythm systolic Murmur, no Rub or gallop 6. Lungs:  Clear to auscultation bilaterally, no wheezes or crackles   7. Abdomen: Soft,  non-tender, Non distended bowel sounds present 8. Lower extremities: no clubbing, cyanosis, no  edema 9. Neurologically Grossly intact, moving all 4 extremities equally  10. MSK: Normal range of motion except for left leg that is In traction    Chart has been reviewed  ______________________________________________________________________________________________  Assessment/Plan 82 y.o. female with medical history significant of  Diffuse B cell lymphoma, Sjogren syndrome, anemia, h xo f C.dif, GERD, HTN    Sp bilateral knees replacements, neuropathy  Admitted for   Fall, Left leg pain Closed comminuted intra-articular fracture of distal end of left femur, initial  encounter     Present on Admission:  Other fracture of left femur, initial encounter for closed fracture (HCC)  Sjogren's syndrome (HCC)  Fall  Cardiac murmur  Elevated BP without diagnosis of hypertension  Abnormal CT scan, cervical spine     Other fracture of left femur, initial encounter for closed fracture (HCC)  - management as per orthopedics,  plan to operate   in  a.m.   Keep nothing by mouth post midnight. 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  Patient has known history of lymphoma although there will be less likely she has been actively followed by oncology discussed at length with family recommended follow-up closely with oncology patient may need repeat PET scan   Other plan as per orders.  DVT prophylaxis:  SCD        Code Status:    Code Status: Prior FULL CODE as per patient   I had personally discussed CODE STATUS with patient and family  ACP   none   Family Communication:   Family  at  Bedside  plan of care was discussed with  Daughter,  Diet  Diet Orders (From admission, onward)     Start     Ordered   07/02/23 0430  Diet NPO time specified  Diet effective ____        07/01/23 2207   07/01/23 2153  Diet Heart Room service appropriate? Yes; Fluid consistency: Thin  Diet effective now       Question Answer Comment  Room service appropriate? Yes   Fluid consistency: Thin      07/01/23 2152            Disposition Plan:  To home once workup is complete and patient is stable   Following barriers for discharge:                                                          Pain controlled with PO medications                                                       Will need consultants to evaluate patient prior to discharge       Consult Orders  (From admission, onward)           Start     Ordered   07/01/23 2051  Consult to hospitalist  Paged by Marsa Aris  Once       Provider:  (Not yet assigned)  Question Answer Comment  Place call to: [facility]   Reason for Consult Admit      07/01/23 2050                     Consults called: orthopedics   Admission status:  ED Disposition     ED Disposition  Admit   Condition  --   Comment  The patient appears reasonably stabilized for admission considering the current resources, flow, and capabilities available in the ED at this time, and I doubt any other [facility] requiring further screening and/or treatment in the ED prior to admission is  present.            inpatient     I Expect 2 midnight stay secondary to severity of patient's current illness need for inpatient interventions justified by the following:    Severe lab/radiological/exam abnormalities including:    Left femur fracture and extensive comorbidities including: Autoimmune disease That are currently affecting medical management.   I expect  patient to be hospitalized for 2 midnights requiring inpatient medical care.  Patient is at high risk for adverse outcome (such as loss of  life or disability) if not treated.  Indication for inpatient stay as follows:   Need for operative/procedural  intervention    Need for  , IV fluids, , IV pain medications,    Level of care     tele  For 12H     No results found for: "[id number]"     Erica Day 07/02/2023, 12:35 AM    [facility]     after 2 AM please page floor coverage PA If 7AM-7PM, please contact the day team taking care of the patient using [email or web address]

## 2023-07-01 NOTE — Progress Notes (Signed)
Orthopedic Tech Progress Note Patient Details:  Ardyce Irelan Nov 21, 1940 [id number]  Applied with daughter at bedside   Musculoskeletal Traction Type of Traction: Bucks Skin Traction Traction Location: LLE Traction Weight: 10 lbs   Post Interventions Patient Tolerated: Fair, Well Instructions Provided: Care of device  Donald Pore 07/01/2023, 9:41 PM

## 2023-07-01 NOTE — ED Notes (Signed)
Called Central monitoring to alert that PT will be off the monitor and going to CT

## 2023-07-01 NOTE — Assessment & Plan Note (Signed)
Obtain echo °

## 2023-07-01 NOTE — ED Triage Notes (Addendum)
PT BIB [facility] after c/o of a fall at home.PT was walking with her walker and fell ground level on her left side. PT has swelling and pain in her left leg only. NO LOC and PT is not on blood thinners. PT received a total of of fentanyl before arrival.

## 2023-07-01 NOTE — Assessment & Plan Note (Signed)
Chronic stable hold immunosuppressive's

## 2023-07-01 NOTE — ED Provider Notes (Signed)
[place] EMERGENCY DEPARTMENT AT [facility] Provider Note   CSN: [id number] Arrival date & time: 07/01/23  1735     History  Chief Complaint  Patient presents with   Marletta Lor    Yadira Theobald is a 82 y.o. female.  Patient is an 82 year old female with past medical history of multiple falls presenting for trip and fall with her walker with pain in the left femur.  Patient admits to head trauma.  Denies blood thinner use.  Denies LOC.  Admits to pain in the left femur.  Denies any other bony tenderness at this time.  States fall was mechanical and that she falls frequently with her walker.  Denies any fevers, chills, nausea, vomiting, coughing, abdominal pain, diarrhea, or dysuria.  Denies any chest pain or shortness of breath.    Hypertensive on arrival but states she did not take her metoprolol 50 mg dose today.   Fall Pertinent negatives include no chest pain, no abdominal pain and no shortness of breath.       Home Medications Prior to Admission medications   Medication Sig Start Date End Date Taking? Authorizing Provider  docusate sodium (COLACE) 100 MG capsule Take 100 mg by mouth as needed for mild constipation.    [provider]  folic acid (FOLVITE) 1 MG tablet Take 1 mg by mouth daily. 04/20/17   [provider]  gabapentin (NEURONTIN) 400 MG capsule Take 400 mg by mouth at bedtime. 12/02/17   [provider]  lidocaine-prilocaine (EMLA) cream Apply 1 application topically as needed (port).     [provider]  metoprolol tartrate (LOPRESSOR) 50 MG tablet Take 50 mg by mouth 2 (two) times daily. 12/08/17   [provider]  mirabegron ER (MYRBETRIQ) 50 MG TB24 tablet Take 50 mg by mouth at bedtime.    [provider]  Multiple Vitamin (MULTIVITAMIN) capsule Take 1 capsule by mouth daily.    [provider]  nystatin (MYCOSTATIN/NYSTOP) powder Apply 1 application topically 2 (two) times daily as needed  (under breast and back).     [provider]  ondansetron (ZOFRAN) 8 MG tablet Take 8 mg by mouth every 8 (eight) hours as needed for nausea or vomiting.  02/07/18   [provider]  potassium chloride SA (K-DUR,KLOR-CON) 20 MEQ tablet Take 20 mEq by mouth every other day.    [provider]  ranitidine (ZANTAC) 150 MG tablet Take 150 mg by mouth 2 (two) times daily. 12/01/17   [provider]  simvastatin (ZOCOR) 20 MG tablet Take 20 mg by mouth at bedtime. 12/08/17   [provider]  Tbo-Filgrastim (GRANIX) 480 MCG/0.8ML SOSY injection Inject 0.8 mLs (480 mcg total) into the skin daily at 6 PM. 02/08/18   Elgergawy, Leana Roe, MD  temazepam (RESTORIL) 30 MG capsule Take 30 mg by mouth at bedtime as needed for sleep.  12/01/17 12/01/18  [provider]  timolol (BETIMOL) 0.5 % ophthalmic solution Place 1 drop into both eyes daily.    [provider]  Travoprost, BAK Free, (TRAVATAN) 0.004 % SOLN ophthalmic solution Place 1 drop into both eyes at bedtime.    [provider]  vancomycin (VANCOCIN) 125 MG capsule Take 125 mg by mouth 3 (three) times daily.  02/04/18   [provider]      Allergies    Benylin cough [diphenhydramine], Codeine, Tape, Aloe vera, Penicillins, Sertraline, and Sulfa antibiotics    Review of Systems   Review of  Systems  Constitutional:  Negative for chills and fever.  HENT:  Negative for ear pain and sore throat.   Eyes:  Negative for pain and visual disturbance.  Respiratory:  Negative for cough and shortness of breath.   Cardiovascular:  Negative for chest pain and palpitations.  Gastrointestinal:  Negative for abdominal pain and vomiting.  Genitourinary:  Negative for dysuria and hematuria.  Musculoskeletal:  Negative for arthralgias and back pain.  Skin:  Positive for wound. Negative for color change and rash.  Neurological:  Negative for seizures and syncope.  All other systems reviewed  and are negative.   Physical Exam Updated Vital Signs BP (!) 170/88   Pulse 67   Temp 98.2 F (36.8 C) (Oral)   Resp 18   Ht 5\' 7"  (1.702 m)   Wt 59 kg   SpO2 97%   BMI 20.37 kg/m  Physical Exam Vitals and nursing note reviewed.  Constitutional:      General: She is not in acute distress.    Appearance: She is well-developed.  HENT:     Head: Normocephalic and atraumatic.  Eyes:     Conjunctiva/sclera: Conjunctivae normal.  Cardiovascular:     Rate and Rhythm: Normal rate and regular rhythm.     Pulses:          Radial pulses are 2+ on the right side and 2+ on the left side.       Dorsalis pedis pulses are 2+ on the right side and 2+ on the left side.     Heart sounds: No murmur heard. Pulmonary:     Effort: Pulmonary effort is normal. No respiratory distress.     Breath sounds: Normal breath sounds.  Abdominal:     Palpations: Abdomen is soft.     Tenderness: There is no abdominal tenderness.  Musculoskeletal:        General: No swelling.     Right shoulder: Normal.     Left shoulder: Normal.     Right upper arm: Normal.     Left upper arm: Normal.     Right elbow: Normal.     Left elbow: Normal.     Right forearm: Normal.     Left forearm: Normal.     Right wrist: Normal.     Left wrist: Normal.     Right hand: Normal.     Left hand: Normal.     Cervical back: Normal and neck supple.     Thoracic back: Normal.     Lumbar back: Normal.     Right hip: Normal.     Left hip: Normal.     Right upper leg: Normal.     Left upper leg: Edema, deformity and bony tenderness present.     Right knee: Normal.     Left knee: Normal.     Right lower leg: Normal.     Left lower leg: Bony tenderness present. No deformity. No edema.     Right ankle: Normal.     Left ankle: Normal.     Right foot: Normal.     Left foot: Normal.  Skin:    General: Skin is warm and dry.     Capillary Refill: Capillary refill takes less than 2 seconds.  Neurological:     Mental Status:  She is alert.  Psychiatric:        Mood and Affect: Mood normal.     ED Results / Procedures / Treatments   Labs (all labs ordered  are listed, but only abnormal results are displayed) Labs Reviewed  BASIC METABOLIC PANEL - Abnormal; Notable for the following components:      Result Value   Glucose, Bld 166 (*)    BUN 33 (*)    All other components within normal limits  CBC WITH DIFFERENTIAL/PLATELET  CBC WITH DIFFERENTIAL/PLATELET  HEPATIC FUNCTION PANEL  CK  MAGNESIUM  PHOSPHORUS  TSH  PROTIME-INR  PREALBUMIN  TYPE AND SCREEN    EKG None  Radiology CT Head Wo Contrast  Result Date: 07/01/2023 CLINICAL DATA:  Head trauma, minor (Age >= 65y); Neck trauma (Age >= 65y) EXAM: CT HEAD WITHOUT CONTRAST CT CERVICAL SPINE WITHOUT CONTRAST TECHNIQUE: Multidetector CT imaging of the head and cervical spine was performed following the standard protocol without intravenous contrast. Multiplanar CT image reconstructions of the cervical spine were also generated. RADIATION DOSE REDUCTION: This exam was performed according to the departmental dose-optimization program which includes automated exposure control, adjustment of the mA and/or kV according to patient size and/or use of iterative reconstruction technique. COMPARISON:  None Available. FINDINGS: CT HEAD FINDINGS Brain: No hemorrhage. No hydrocephalus. No extra-axial fluid collection. No CT evidence of an acute cortical infarct. No mass effect. No mass lesion. There is sequela of moderate overall chronic microvascular ischemic change. 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 Skull: Normal. Negative for fracture or focal lesion. Sinuses/Orbits: No middle ear or mastoid effusion. Paranasal sinuses are clear. Bilateral lens replacement. Orbits are otherwise unremarkable. Other: None. CT CERVICAL SPINE FINDINGS Alignment: Grade 1 anterolisthesis of C5 on C6. Skull  base and vertebrae: No acute fracture. There is a nonspecific hyperdense lesion in the C5 vertebral body level. Recommend comparison with prior imaging to exclude the possibility of osseous metastatic disease Soft tissues and spinal canal: No prevertebral fluid or swelling. No visible canal hematoma. Disc levels:  No evidence of high-grade spinal canal stenosis. Upper chest: Negative. Other: None IMPRESSION: 1. No acute intracranial abnormality. 2. No acute fracture or traumatic subluxation of the cervical spine. 3. Nonspecific hyperdense lesion in the C5 vertebral body level. Recommend comparison with prior imaging to exclude the possibility of osseous metastatic disease. Electronically Signed   By: Lorenza Cambridge M.D.   On: 07/01/2023 19:36   CT Cervical Spine Wo Contrast  Result Date: 07/01/2023 CLINICAL DATA:  Head trauma, minor (Age >= 65y); Neck trauma (Age >= 65y) EXAM: CT HEAD WITHOUT CONTRAST CT CERVICAL SPINE WITHOUT CONTRAST TECHNIQUE: Multidetector CT imaging of the head and cervical spine was performed following the standard protocol without intravenous contrast. Multiplanar CT image reconstructions of the cervical spine were also generated. RADIATION DOSE REDUCTION: This exam was performed according to the departmental dose-optimization program which includes automated exposure control, adjustment of the mA and/or kV according to patient size and/or use of iterative reconstruction technique. COMPARISON:  None Available. FINDINGS: CT HEAD FINDINGS Brain: No hemorrhage. No hydrocephalus. No extra-axial fluid collection. No CT evidence of an acute cortical infarct. No mass effect. No mass lesion. There is sequela of moderate overall chronic microvascular ischemic change. 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 Skull: Normal. Negative for fracture or focal lesion. Sinuses/Orbits: No middle ear or mastoid effusion. Paranasal  sinuses are clear. Bilateral lens replacement. Orbits are otherwise unremarkable. Other: None. CT CERVICAL SPINE FINDINGS Alignment: Grade 1 anterolisthesis of C5 on C6. Skull base and vertebrae: No acute fracture. There is a  nonspecific hyperdense lesion in the C5 vertebral body level. Recommend comparison with prior imaging to exclude the possibility of osseous metastatic disease Soft tissues and spinal canal: No prevertebral fluid or swelling. No visible canal hematoma. Disc levels:  No evidence of high-grade spinal canal stenosis. Upper chest: Negative. Other: None IMPRESSION: 1. No acute intracranial abnormality. 2. No acute fracture or traumatic subluxation of the cervical spine. 3. Nonspecific hyperdense lesion in the C5 vertebral body level. Recommend comparison with prior imaging to exclude the possibility of osseous metastatic disease. Electronically Signed   By: Lorenza Cambridge M.D.   On: 07/01/2023 19:36   DG Tibia/Fibula Left  Result Date: 07/01/2023 CLINICAL DATA:  Pain after fall. EXAM: LEFT TIBIA AND FIBULA - 2 VIEW COMPARISON:  None Available. FINDINGS: No evidence of lower leg fracture. Knee arthroplasty is intact were visualized. Distal femur fracture is partially included in the field of view. Ankle alignment is intact, distal most tibia and fibula not included in the AP view. Prominent vascular calcifications. IMPRESSION: No lower leg fracture. Electronically Signed   By: Narda Rutherford M.D.   On: 07/01/2023 19:33   DG Femur Min 2 Views Left  Result Date: 07/01/2023 CLINICAL DATA:  Left femoral deformity after fall, pain distally. EXAM: LEFT FEMUR 2 VIEWS COMPARISON:  None Available. FINDINGS: Comminuted, angulated and displaced distal femur fracture. No convincing fracture extension to the knee arthroplasty. Femoral shaft and proximal femur are intact. Advanced vascular calcifications. IMPRESSION: Comminuted, angulated and displaced distal femur fracture. No convincing involvement of  the knee arthroplasty Electronically Signed   By: Narda Rutherford M.D.   On: 07/01/2023 19:32   DG Pelvis 1-2 Views  Result Date: 07/01/2023 CLINICAL DATA:  Pain after fall. EXAM: PELVIS - 1-2 VIEW COMPARISON:  None Available. FINDINGS: The bones are subjectively under mineralized. The cortical margins of the bony pelvis are intact. No fracture. Pubic symphysis and sacroiliac joints are congruent. Both femoral heads are well-seated in the respective acetabula. No hip dislocation. Mild bilateral hip osteoarthritis. Prominent vascular calcifications. IMPRESSION: 1. No pelvic fracture. 2. Bilateral hip osteoarthritis. Electronically Signed   By: Narda Rutherford M.D.   On: 07/01/2023 19:31    Procedures Procedures    Medications Ordered in ED Medications  ondansetron (ZOFRAN) injection 4 mg (4 mg Intravenous Given 07/01/23 1954)  metoprolol tartrate (LOPRESSOR) tablet 50 mg (25 mg Oral Given 07/01/23 2000)  fentaNYL (SUBLIMAZE) injection 50 mcg (50 mcg Intravenous Given 07/01/23 1900)  metoprolol tartrate (LOPRESSOR) tablet 25 mg (25 mg Oral Given 07/01/23 1954)  fentaNYL (SUBLIMAZE) 50 MCG/ML injection (50 mcg  Given 07/01/23 2125)    ED Course/ Medical Decision Making/ A&P                                 Medical Decision Making Amount and/or Complexity of Data Reviewed Labs: ordered. Radiology: ordered.  Risk Prescription drug management. Decision regarding hospitalization.   82 year old female with past medical history of multiple falls presenting for trip and fall with her walker with pain in the left femur.  Patient is alert oriented x 3, no acute distress, hypertensive at 175/87 with otherwise stable vital signs.  Did not take her home dose of metoprolol 50 mg.  Will order now.  Patient has a gross deformity of the left femur.  Dorsalis pedis intact.  Sensation intact.  X-rays pending.  Fentanyl given.  CT head and neck demonstrates no acute process.  X-ray of left femur  demonstrates a comminuted angulated left distal femur fracture.  I spoke with orthopedic surgeon on-call Dr. Aundria Rud who recommends Buck's traction at 10 pounds.  Order placed.  Request hospitalist admit.  N.p.o. after midnight.  Plan for surgery tomorrow.        Final Clinical Impression(s) / ED Diagnoses Final diagnoses:  Fall, initial encounter  Left leg pain  Closed comminuted intra-articular fracture of distal end of left femur, initial encounter [facility])    Rx / DC Orders ED Discharge Orders     None         Franne Forts, DO 07/01/23 2137

## 2023-07-01 NOTE — ED Notes (Signed)
LAB called and 2nd CBC is hemolyzed.

## 2023-07-02 ENCOUNTER — Encounter (HOSPITAL_COMMUNITY)
Admission: EM | Disposition: A | Discharge status: Skilled Nursing Facility | Payer: Self-pay | Source: Home / Self Care | Attending: Internal Medicine

## 2023-07-02 ENCOUNTER — Inpatient Hospital Stay (HOSPITAL_COMMUNITY): Payer: Medicare Other

## 2023-07-02 ENCOUNTER — Other Ambulatory Visit: Payer: Self-pay

## 2023-07-02 ENCOUNTER — Other Ambulatory Visit (HOSPITAL_COMMUNITY): Payer: Medicare Other

## 2023-07-02 ENCOUNTER — Inpatient Hospital Stay (HOSPITAL_COMMUNITY): Payer: Medicare Other | Admitting: Anesthesiology

## 2023-07-02 DIAGNOSIS — N189 Chronic kidney disease, unspecified: Secondary | ICD-10-CM | POA: Diagnosis not present

## 2023-07-02 DIAGNOSIS — M978XXA Periprosthetic fracture around other internal prosthetic joint, initial encounter: Secondary | ICD-10-CM | POA: Diagnosis not present

## 2023-07-02 DIAGNOSIS — D62 Acute posthemorrhagic anemia: Secondary | ICD-10-CM

## 2023-07-02 DIAGNOSIS — D696 Thrombocytopenia, unspecified: Secondary | ICD-10-CM | POA: Diagnosis not present

## 2023-07-02 DIAGNOSIS — S72452A Displaced supracondylar fracture without intracondylar extension of lower end of left femur, initial encounter for closed fracture: Secondary | ICD-10-CM

## 2023-07-02 DIAGNOSIS — I129 Hypertensive chronic kidney disease with stage 1 through stage 4 chronic kidney disease, or unspecified chronic kidney disease: Secondary | ICD-10-CM | POA: Diagnosis not present

## 2023-07-02 DIAGNOSIS — W19XXXD Unspecified fall, subsequent encounter: Secondary | ICD-10-CM | POA: Diagnosis not present

## 2023-07-02 DIAGNOSIS — Z96649 Presence of unspecified artificial hip joint: Secondary | ICD-10-CM

## 2023-07-02 HISTORY — PX: ORIF FEMUR FRACTURE: SHX2119

## 2023-07-02 LAB — CBC
HCT: 35.8 % — ABNORMAL LOW (ref 36.0–46.0)
Hemoglobin: 11.5 g/dL — ABNORMAL LOW (ref 12.0–15.0)
MCH: 32.4 pg (ref 26.0–34.0)
MCHC: 32.1 g/dL (ref 30.0–36.0)
MCV: 100.8 fL — ABNORMAL HIGH (ref 80.0–100.0)
Platelets: 106 10*3/uL — ABNORMAL LOW (ref 150–400)
RBC: 3.55 MIL/uL — ABNORMAL LOW (ref 3.87–5.11)
RDW: 12.9 % (ref 11.5–15.5)
WBC: 7.7 10*3/uL (ref 4.0–10.5)
nRBC: 0 % (ref 0.0–0.2)

## 2023-07-02 LAB — TYPE AND SCREEN
ABO/RH(D): A POS
Antibody Screen: POSITIVE

## 2023-07-02 LAB — CBC WITH DIFFERENTIAL/PLATELET
Abs Immature Granulocytes: 0.03 10*3/uL (ref 0.00–0.07)
Basophils Absolute: 0 10*3/uL (ref 0.0–0.1)
Basophils Relative: 0 %
Eosinophils Absolute: 0 10*3/uL (ref 0.0–0.5)
Eosinophils Relative: 0 %
HCT: 40.3 % (ref 36.0–46.0)
Hemoglobin: 13.5 g/dL (ref 12.0–15.0)
Immature Granulocytes: 0 %
Lymphocytes Relative: 9 %
Lymphs Abs: 0.7 10*3/uL (ref 0.7–4.0)
MCH: 32 pg (ref 26.0–34.0)
MCHC: 33.5 g/dL (ref 30.0–36.0)
MCV: 95.5 fL (ref 80.0–100.0)
Monocytes Absolute: 0.3 10*3/uL (ref 0.1–1.0)
Monocytes Relative: 4 %
Neutro Abs: 6.7 10*3/uL (ref 1.7–7.7)
Neutrophils Relative %: 87 %
Platelets: 139 10*3/uL — ABNORMAL LOW (ref 150–400)
RBC: 4.22 MIL/uL (ref 3.87–5.11)
RDW: 12.8 % (ref 11.5–15.5)
WBC: 7.7 10*3/uL (ref 4.0–10.5)
nRBC: 0 % (ref 0.0–0.2)

## 2023-07-02 LAB — SURGICAL PCR SCREEN
MRSA, PCR: NEGATIVE
Staphylococcus aureus: POSITIVE — AB

## 2023-07-02 LAB — CREATININE, SERUM
Creatinine, Ser: 1.27 mg/dL — ABNORMAL HIGH (ref 0.44–1.00)
GFR, Estimated: 42 mL/min — ABNORMAL LOW (ref >60)

## 2023-07-02 LAB — VITAMIN D 25 HYDROXY (VIT D DEFICIENCY, FRACTURES): Vit D, 25-Hydroxy: 49.04 ng/mL (ref 30–100)

## 2023-07-02 LAB — PREALBUMIN: Prealbumin: 23 mg/dL (ref 18–38)

## 2023-07-02 LAB — TROPONIN I (HIGH SENSITIVITY): Troponin I (High Sensitivity): 92 ng/L — ABNORMAL HIGH (ref <18)

## 2023-07-02 SURGERY — OPEN REDUCTION INTERNAL FIXATION (ORIF) DISTAL FEMUR FRACTURE
Anesthesia: General | Site: Leg Upper | Laterality: Left

## 2023-07-02 MED ORDER — ONDANSETRON HCL 4 MG/2ML IJ SOLN: 4.0000 mg | Freq: Four times a day (QID) | INTRAMUSCULAR | Status: DC | PRN

## 2023-07-02 MED ORDER — ENSURE ENLIVE PO LIQD
237.0000 mL | Freq: Two times a day (BID) | ORAL | Status: DC
  Administered 2023-07-04: 237 mL via ORAL

## 2023-07-02 MED ORDER — DEXAMETHASONE SODIUM PHOSPHATE 4 MG/ML IJ SOLN
INTRAMUSCULAR | Status: DC | PRN
  Administered 2023-07-02: 5 mg via PERINEURAL

## 2023-07-02 MED ORDER — 0.9 % SODIUM CHLORIDE (POUR BTL) OPTIME
TOPICAL | Status: DC | PRN
  Administered 2023-07-02: 1000 mL

## 2023-07-02 MED ORDER — PHENYLEPHRINE 80 MCG/ML (10ML) SYRINGE FOR IV PUSH (FOR BLOOD PRESSURE SUPPORT)
PREFILLED_SYRINGE | INTRAVENOUS | Status: DC | PRN
  Administered 2023-07-02 (×3): 160 ug via INTRAVENOUS
  Administered 2023-07-02: 200 ug via INTRAVENOUS
  Administered 2023-07-02: 160 ug via INTRAVENOUS

## 2023-07-02 MED ORDER — DOCUSATE SODIUM 100 MG PO CAPS
100.0000 mg | ORAL_CAPSULE | Freq: Two times a day (BID) | ORAL | Status: DC
  Administered 2023-07-02 – 2023-07-05 (×7): 100 mg via ORAL
  Filled 2023-07-02 (×7): qty 1

## 2023-07-02 MED ORDER — CLONIDINE HCL (ANALGESIA) 100 MCG/ML EP SOLN
EPIDURAL | Status: DC | PRN
  Administered 2023-07-02: 80 ug

## 2023-07-02 MED ORDER — CEFAZOLIN SODIUM-DEXTROSE 2-4 GM/100ML-% IV SOLN
2.0000 g | Freq: Three times a day (TID) | INTRAVENOUS | Status: DC
  Administered 2023-07-02 – 2023-07-03 (×3): 2 g via INTRAVENOUS
  Filled 2023-07-02 (×3): qty 100

## 2023-07-02 MED ORDER — ACETAMINOPHEN 500 MG PO TABS
ORAL_TABLET | ORAL | Status: AC
  Administered 2023-07-02: 1000 mg via ORAL
  Filled 2023-07-02: qty 2

## 2023-07-02 MED ORDER — FENTANYL CITRATE (PF) 250 MCG/5ML IJ SOLN
INTRAMUSCULAR | Status: DC | PRN
  Administered 2023-07-02 (×2): 50 ug via INTRAVENOUS

## 2023-07-02 MED ORDER — LACTATED RINGERS IV SOLN: INTRAVENOUS | Status: DC | PRN

## 2023-07-02 MED ORDER — ROCURONIUM BROMIDE 10 MG/ML (PF) SYRINGE
PREFILLED_SYRINGE | INTRAVENOUS | Status: DC | PRN
  Administered 2023-07-02: 50 mg via INTRAVENOUS

## 2023-07-02 MED ORDER — CHLORHEXIDINE GLUCONATE 0.12 % MT SOLN: 15.0000 mL | Freq: Once | OROMUCOSAL | Status: AC

## 2023-07-02 MED ORDER — FENTANYL CITRATE (PF) 100 MCG/2ML IJ SOLN
25.0000 ug | INTRAMUSCULAR | Status: DC | PRN
  Administered 2023-07-02 (×2): 25 ug via INTRAVENOUS

## 2023-07-02 MED ORDER — ORAL CARE MOUTH RINSE: 15.0000 mL | Freq: Once | OROMUCOSAL | Status: AC

## 2023-07-02 MED ORDER — VANCOMYCIN HCL 1000 MG IV SOLR
INTRAVENOUS | Status: AC
  Filled 2023-07-02: qty 20

## 2023-07-02 MED ORDER — FENTANYL CITRATE (PF) 100 MCG/2ML IJ SOLN
INTRAMUSCULAR | Status: AC
  Administered 2023-07-02: 25 ug via INTRAVENOUS
  Filled 2023-07-02: qty 2

## 2023-07-02 MED ORDER — ONDANSETRON HCL 4 MG PO TABS: 4.0000 mg | ORAL_TABLET | Freq: Four times a day (QID) | ORAL | Status: DC | PRN

## 2023-07-02 MED ORDER — FENTANYL CITRATE (PF) 100 MCG/2ML IJ SOLN
INTRAMUSCULAR | Status: DC | PRN
  Administered 2023-07-02: 50 ug via INTRAVENOUS

## 2023-07-02 MED ORDER — EPHEDRINE SULFATE-NACL 50-0.9 MG/10ML-% IV SOSY
PREFILLED_SYRINGE | INTRAVENOUS | Status: DC | PRN
  Administered 2023-07-02: 5 mg via INTRAVENOUS
  Administered 2023-07-02 (×3): 10 mg via INTRAVENOUS

## 2023-07-02 MED ORDER — SUGAMMADEX SODIUM 200 MG/2ML IV SOLN
INTRAVENOUS | Status: DC | PRN
  Administered 2023-07-02: 200 mg via INTRAVENOUS

## 2023-07-02 MED ORDER — METOCLOPRAMIDE HCL 5 MG/ML IJ SOLN: 5.0000 mg | Freq: Three times a day (TID) | INTRAMUSCULAR | Status: DC | PRN

## 2023-07-02 MED ORDER — ACETAMINOPHEN 500 MG PO TABS: 1000.0000 mg | ORAL_TABLET | Freq: Once | ORAL | Status: AC

## 2023-07-02 MED ORDER — PROPOFOL 10 MG/ML IV BOLUS
INTRAVENOUS | Status: DC | PRN
  Administered 2023-07-02: 80 mg via INTRAVENOUS

## 2023-07-02 MED ORDER — ROPIVACAINE HCL 5 MG/ML IJ SOLN
INTRAMUSCULAR | Status: DC | PRN
  Administered 2023-07-02: 30 mL via PERINEURAL

## 2023-07-02 MED ORDER — MORPHINE SULFATE (PF) 2 MG/ML IV SOLN
1.0000 mg | INTRAVENOUS | Status: DC | PRN
  Administered 2023-07-02: 2 mg via INTRAVENOUS
  Filled 2023-07-02 (×2): qty 1

## 2023-07-02 MED ORDER — POLYETHYLENE GLYCOL 3350 17 G PO PACK: 17.0000 g | PACK | Freq: Every day | ORAL | Status: DC | PRN

## 2023-07-02 MED ORDER — DEXAMETHASONE SODIUM PHOSPHATE 10 MG/ML IJ SOLN
INTRAMUSCULAR | Status: DC | PRN
  Administered 2023-07-02: 4 mg via INTRAVENOUS

## 2023-07-02 MED ORDER — FENTANYL CITRATE (PF) 250 MCG/5ML IJ SOLN
INTRAMUSCULAR | Status: AC
  Filled 2023-07-02: qty 5

## 2023-07-02 MED ORDER — METOCLOPRAMIDE HCL 5 MG PO TABS: 5.0000 mg | ORAL_TABLET | Freq: Three times a day (TID) | ORAL | Status: DC | PRN

## 2023-07-02 MED ORDER — VANCOMYCIN HCL 1000 MG IV SOLR
INTRAVENOUS | Status: DC | PRN
  Administered 2023-07-02: 1000 mg

## 2023-07-02 MED ORDER — DROPERIDOL 2.5 MG/ML IJ SOLN: 0.6250 mg | Freq: Once | INTRAMUSCULAR | Status: DC | PRN

## 2023-07-02 MED ORDER — ONDANSETRON HCL 4 MG/2ML IJ SOLN
INTRAMUSCULAR | Status: DC | PRN
  Administered 2023-07-02: 4 mg via INTRAVENOUS

## 2023-07-02 MED ORDER — ENOXAPARIN SODIUM 40 MG/0.4ML IJ SOSY
40.0000 mg | PREFILLED_SYRINGE | INTRAMUSCULAR | Status: DC
  Administered 2023-07-03 – 2023-07-06 (×4): 40 mg via SUBCUTANEOUS
  Filled 2023-07-02 (×4): qty 0.4

## 2023-07-02 MED ORDER — TRAMADOL HCL 50 MG PO TABS
50.0000 mg | ORAL_TABLET | Freq: Four times a day (QID) | ORAL | Status: DC | PRN
Start: 2023-07-02 — End: 2023-07-07
  Administered 2023-07-02 – 2023-07-06 (×5): 50 mg via ORAL
  Filled 2023-07-02 (×5): qty 1

## 2023-07-02 MED ORDER — PROPOFOL 10 MG/ML IV BOLUS
INTRAVENOUS | Status: AC
  Filled 2023-07-02: qty 20

## 2023-07-02 MED ORDER — TRANEXAMIC ACID-NACL 1000-0.7 MG/100ML-% IV SOLN
1000.0000 mg | Freq: Once | INTRAVENOUS | Status: AC
  Administered 2023-07-02: 1000 mg via INTRAVENOUS
  Filled 2023-07-02: qty 100

## 2023-07-02 MED ORDER — MUPIROCIN 2 % EX OINT
1.0000 | TOPICAL_OINTMENT | Freq: Two times a day (BID) | CUTANEOUS | Status: DC
  Administered 2023-07-02 – 2023-07-06 (×9): 1 via NASAL
  Filled 2023-07-02 (×2): qty 22

## 2023-07-02 MED ORDER — CHLORHEXIDINE GLUCONATE 0.12 % MT SOLN
OROMUCOSAL | Status: AC
  Administered 2023-07-02: 15 mL via OROMUCOSAL
  Filled 2023-07-02: qty 15

## 2023-07-02 MED ORDER — LACTATED RINGERS IV SOLN: INTRAVENOUS | Status: DC

## 2023-07-02 SURGICAL SUPPLY — 73 items
BAG COUNTER SPONGE SURGICOUNT (BAG) ×1 IMPLANT
BIT DRILL 4.3 (BIT) ×1
BIT DRILL 4.3X300MM (BIT) IMPLANT
BIT DRILL LONG 3.3 (BIT) IMPLANT
BIT DRILL QC 3.3X195 (BIT) IMPLANT
BLADE CLIPPER SURG (BLADE) IMPLANT
BNDG COHESIVE 6X5 TAN ST LF (GAUZE/BANDAGES/DRESSINGS) ×1 IMPLANT
BNDG ELASTIC 4INX 5YD STR LF (GAUZE/BANDAGES/DRESSINGS) IMPLANT
BNDG ELASTIC 6INX 5YD STR LF (GAUZE/BANDAGES/DRESSINGS) IMPLANT
BNDG ELASTIC 6X10 VLCR STRL LF (GAUZE/BANDAGES/DRESSINGS) ×1 IMPLANT
BRUSH SCRUB EZ PLAIN DRY (MISCELLANEOUS) ×2 IMPLANT
CANISTER SUCT 3000ML PPV (MISCELLANEOUS) ×1 IMPLANT
CAP LOCK NCB (Cap) IMPLANT
CHLORAPREP W/TINT 26 (MISCELLANEOUS) ×1 IMPLANT
COVER SURGICAL LIGHT HANDLE (MISCELLANEOUS) ×1 IMPLANT
DERMABOND ADVANCED .7 DNX12 (GAUZE/BANDAGES/DRESSINGS) IMPLANT
DRAPE C-ARM 42X72 X-RAY (DRAPES) ×1 IMPLANT
DRAPE C-ARMOR (DRAPES) ×1 IMPLANT
DRAPE HALF SHEET 40X57 (DRAPES) ×2 IMPLANT
DRAPE SURG 17X23 STRL (DRAPES) ×1 IMPLANT
DRAPE SURG ORHT 6 SPLT 77X108 (DRAPES) ×2 IMPLANT
DRAPE U-SHAPE 47X51 STRL (DRAPES) ×1 IMPLANT
DRESSING MEPILEX FLEX 4X4 (GAUZE/BANDAGES/DRESSINGS) IMPLANT
DRSG ADAPTIC 3X8 NADH LF (GAUZE/BANDAGES/DRESSINGS) IMPLANT
DRSG MEPILEX FLEX 4X4 (GAUZE/BANDAGES/DRESSINGS)
DRSG MEPILEX POST OP 4X12 (GAUZE/BANDAGES/DRESSINGS) IMPLANT
DRSG MEPILEX POST OP 4X8 (GAUZE/BANDAGES/DRESSINGS) IMPLANT
DRSG MEPILEX SACRM 8.7X9.8 (GAUZE/BANDAGES/DRESSINGS) IMPLANT
ELECT REM PT RETURN 9FT ADLT (ELECTROSURGICAL) ×1
ELECTRODE REM PT RTRN 9FT ADLT (ELECTROSURGICAL) ×1 IMPLANT
GAUZE PAD ABD 8X10 STRL (GAUZE/BANDAGES/DRESSINGS) ×3 IMPLANT
GAUZE SPONGE 4X4 12PLY STRL (GAUZE/BANDAGES/DRESSINGS) ×1 IMPLANT
GLOVE BIO SURGEON STRL SZ 6.5 (GLOVE) ×3 IMPLANT
GLOVE BIO SURGEON STRL SZ7.5 (GLOVE) ×4 IMPLANT
GLOVE BIOGEL PI IND STRL 6.5 (GLOVE) ×1 IMPLANT
GLOVE BIOGEL PI IND STRL 7.5 (GLOVE) ×1 IMPLANT
GOWN STRL REUS W/ TWL LRG LVL3 (GOWN DISPOSABLE) ×3 IMPLANT
GOWN STRL REUS W/TWL LRG LVL3 (GOWN DISPOSABLE) ×3
K-WIRE 2.0 (WIRE) ×1
K-WIRE FXSTD 280X2XNS SS (WIRE) ×1
KIT BASIN OR (CUSTOM PROCEDURE TRAY) ×1 IMPLANT
KIT TURNOVER KIT B (KITS) ×1 IMPLANT
KWIRE FXSTD 280X2XNS SS (WIRE) IMPLANT
NS IRRIG 1000ML POUR BTL (IV SOLUTION) ×1 IMPLANT
PACK TOTAL JOINT (CUSTOM PROCEDURE TRAY) ×1 IMPLANT
PAD ARMBOARD 7.5X6 YLW CONV (MISCELLANEOUS) ×2 IMPLANT
PAD CAST 4YDX4 CTTN HI CHSV (CAST SUPPLIES) ×1 IMPLANT
PADDING CAST COTTON 4X4 STRL (CAST SUPPLIES) ×1
PADDING CAST COTTON 6X4 STRL (CAST SUPPLIES) ×1 IMPLANT
PLATE DIST FEM 12H (Plate) IMPLANT
SCREW 5.0 32MM (Screw) IMPLANT
SCREW 5.0 70MM (Screw) IMPLANT
SCREW 5.0 80MM (Screw) IMPLANT
SCREW NCB 3.5X75X5X6.2XST (Screw) IMPLANT
SCREW NCB 4.0 32MM (Screw) IMPLANT
SCREW NCB 5.0X36MM (Screw) IMPLANT
SCREW NCB 5.0X75MM (Screw) ×4 IMPLANT
SPONGE T-LAP 18X18 ~~LOC~~+RFID (SPONGE) IMPLANT
STAPLER VISISTAT 35W (STAPLE) ×1 IMPLANT
SUCTION TUBE FRAZIER 10FR DISP (SUCTIONS) ×1 IMPLANT
SUT ETHILON 3 0 PS 1 (SUTURE) ×2 IMPLANT
SUT MNCRL+ AB 3-0 CT1 36 (SUTURE) IMPLANT
SUT MON AB 2-0 CT1 36 (SUTURE) IMPLANT
SUT VIC AB 0 CT1 27 (SUTURE) ×1
SUT VIC AB 0 CT1 27XBRD ANBCTR (SUTURE) IMPLANT
SUT VIC AB 1 CT1 27 (SUTURE)
SUT VIC AB 1 CT1 27XBRD ANBCTR (SUTURE) IMPLANT
SUT VIC AB 2-0 CT1 27 (SUTURE)
SUT VIC AB 2-0 CT1 TAPERPNT 27 (SUTURE) ×2 IMPLANT
SUT VIC AB CT1 27XBRD ANBCTRL (SUTURE) ×1
TOWEL GREEN STERILE (TOWEL DISPOSABLE) ×2 IMPLANT
TRAY FOLEY MTR SLVR 16FR STAT (SET/KITS/TRAYS/PACK) IMPLANT
WATER STERILE IRR 1000ML POUR (IV SOLUTION) ×2 IMPLANT

## 2023-07-02 NOTE — Progress Notes (Addendum)
[facility] Pre-op Review  Pre-op checklist: Per Ashia, RN it has been done  NPO: Yes  Labs: Platelets 139, PCR + for Staph  Consent: Ordered  H&P: Hospitalist  Vitals: Elevated BP  O2 requirements: RA  MAR/PTA review: Metoprolol 25mg  given at 1954 (11/14) and metoprolol 50mg  given at 2000 (11/15), mupirocin due at 1000  IV: 20g  R A/C  Floor nurse name:  Mateo Flow, RN   Additional info:   Spanish speaking On contact for ESBL Betadine ordered

## 2023-07-02 NOTE — Progress Notes (Signed)
11/15 Patient was out of the room for a procedure. I spoke to patient's daughter(Rosie) who is listed a contact. The IMM Letter Was explained and  verbally acknowledged via telephone, the daughter stated that she also signed the letter @ approx. 1:00 pm -ish with a member of the registration staff  that visited the room.

## 2023-07-02 NOTE — Interval H&P Note (Signed)
Patient is indicated for open reduction internal fixation of left supracondylar distal femur fracture.  Risks and benefits were discussed with the patient and her daughter.  They agreed to proceed with surgery and consent was obtained.  Roby Lofts, MD [facility] [phone] (office) [email or web address]

## 2023-07-02 NOTE — Anesthesia Postprocedure Evaluation (Signed)
Anesthesia Post Note  Patient: Erica Day  Procedure(s) Performed: OPEN REDUCTION INTERNAL FIXATION (ORIF) DISTAL FEMUR FRACTURE (Left: Leg Upper)     Patient location during evaluation: PACU Anesthesia Type: General Level of consciousness: sedated and patient cooperative Pain management: pain level controlled Vital Signs Assessment: post-procedure vital signs reviewed and stable Respiratory status: spontaneous breathing Cardiovascular status: stable Anesthetic complications: no   No notable events documented.  Last Vitals:  Vitals:   07/02/23 1218 07/02/23 1230  BP: (!) 170/90 (!) 152/127  Pulse: 91 81  Resp: 20 (!) 21  Temp:    SpO2: 98% 94%    Last Pain:  Vitals:   07/02/23 1022  TempSrc:   PainSc: 0-No pain                 Lewie Loron

## 2023-07-02 NOTE — Plan of Care (Signed)

## 2023-07-02 NOTE — Anesthesia Preprocedure Evaluation (Addendum)
Anesthesia Evaluation  Patient identified by MRN, date of birth, ID band Patient awake    Reviewed: Allergy & Precautions, NPO status , Patient's Chart, lab work & pertinent test results  Airway Mallampati: III  TM Distance: >3 FB Neck ROM: Full    Dental  (+) Dental Advisory Given, Teeth Intact, Poor Dentition, Missing   Pulmonary neg pulmonary ROS   Pulmonary exam normal breath sounds clear to auscultation       Cardiovascular hypertension, Pt. on home beta blockers Normal cardiovascular exam+ Valvular Problems/Murmurs  Rhythm:Regular Rate:Normal     Neuro/Psych negative neurological ROS     GI/Hepatic Neg liver ROS,GERD  Medicated,,  Endo/Other  negative endocrine ROS    Renal/GU Renal disease     Musculoskeletal  (+) Arthritis ,    Abdominal   Peds  Hematology  (+) Blood dyscrasia, anemia   Anesthesia Other Findings   Reproductive/Obstetrics                             Anesthesia Physical Anesthesia Plan  ASA: 3  Anesthesia Plan: General   Post-op Pain Management: Regional block*, Tylenol PO (pre-op)* and Gabapentin PO (pre-op)*   Induction:   PONV Risk Score and Plan: 3 and Ondansetron, Dexamethasone and Treatment may vary due to age or medical condition  Airway Management Planned: Oral ETT  Additional Equipment: None  Intra-op Plan:   Post-operative Plan: Extubation in OR  Informed Consent: I have reviewed the patients History and Physical, chart, labs and discussed the procedure including the risks, benefits and alternatives for the proposed anesthesia with the patient or authorized representative who has indicated his/her understanding and acceptance.     Dental advisory given  Plan Discussed with: CRNA  Anesthesia Plan Comments: (Risks of anesthesia explained at length. This includes, but is not limited to, sore throat, damage to teeth, lips gums, tongue and  vocal cords, nausea and vomiting, reactions to medications, stroke, heart attack, and death. All patient questions were answered and the patient wishes to proceed. Risks of peripheral nerve block explained at length. This includes, but is not limited to, bleeding, infection, reactions to the medications, seizures, damage to surrounding structures, damage to nerves, permanent weakness, numbness, tingling and pain. All patient questions were answered and patient wishes to proceed with nerve block. )       Anesthesia Quick Evaluation

## 2023-07-02 NOTE — Care Management Important Message (Signed)
Important Message  Patient Details  Name: Erica Day MRN: [id number] Date of Birth: 1941-02-17   Important Message Given:  Other (see comment)     Sherilyn Banker 07/02/2023, 4:21 PM

## 2023-07-02 NOTE — Evaluation (Signed)
Physical Therapy Evaluation Patient Details Name: Erica Day MRN: [id number] DOB: Jul 11, 1941 Today's Date: 07/02/2023  History of Present Illness  82 y.o. female presents to [facility] 07/01/23 after losing her balance and falling at home. Pt with L periprosthetic supracondylar distal femur fx s/p ORIF 11/15. PMHx: arthritis, GERD, HTN, Sjorgren's syndrome, B TKA, neuropathy   Clinical Impression  Pt in bed upon arrival and agreeable to PT eval. Prior to admission, pt was modI with a RW for mobility. Pt reports needing assistance with upper body dressing as well as for household tasks. Pt required increased time and effort to perform bed mobility this date with ModA for supine/sit. Pt has limited L LE ROM and strength due to increased pain with muscle activation. Pt was able to stand with ModA and RW, however, was unable to tolerate standing for <30 seconds and returned to sitting. Pt presents to therapy session with decreased LE strength/ROM, balance, mobility and activity tolerance. Pt would benefit from acute skilled PT to address functional impairments. Recommending post-acute rehab <3hrs to help pt return to previous level of independence. Pt reports having a prior stay at a skilled facility with a positive experience. Acute PT to follow.          If plan is discharge home, recommend the following: A lot of help with walking and/or transfers;A lot of help with bathing/dressing/bathroom;Assistance with cooking/housework;Assist for transportation   Can travel by private vehicle   No    Equipment Recommendations None recommended by PT     Functional Status Assessment Patient has had a recent decline in their functional status and demonstrates the ability to make significant improvements in function in a reasonable and predictable amount of time.     Precautions / Restrictions Precautions Precautions: Fall Restrictions Weight Bearing Restrictions: Yes LLE Weight Bearing: Weight bearing as  tolerated      Mobility  Bed Mobility Overal bed mobility: Needs Assistance Bed Mobility: Supine to Sit, Sit to Supine     Supine to sit: HOB elevated, Used rails, Mod assist Sit to supine: HOB elevated, Used rails, Contact guard assist   General bed mobility comments: increased time and effort, pt allowed help with L LE management for supine/sit transfer. Pt did not want assistance for sit/supine transfer, was able to pull legs onto bed and then lower trunk. TotalAx2 to scoot towards HOB in supine    Transfers Overall transfer level: Needs assistance Equipment used: Rolling walker (2 wheels) Transfers: Sit to/from Stand Sit to Stand: Mod assist    General transfer comment: ModA for boost up and initial rise. Pt steady in standing, however, sat down promptly due to pain in L LE with weightbearing    Ambulation/Gait  General Gait Details: unable to take steps        Balance Overall balance assessment: Needs assistance Sitting-balance support: Feet supported, No upper extremity supported Sitting balance-Leahy Scale: Good     Standing balance support: Bilateral upper extremity supported, During functional activity, Reliant on assistive device for balance Standing balance-Leahy Scale: Poor Standing balance comment: reliant on RW, only able to stand for ~30 s due to pain        Pertinent Vitals/Pain Pain Assessment Pain Assessment: Faces Faces Pain Scale: Hurts even more Pain Location: L LE Pain Descriptors / Indicators: Aching, Discomfort Pain Intervention(s): Limited activity within patient's tolerance, Monitored during session, Premedicated before session, Repositioned    Home Living Family/patient expects to be discharged to:: Private residence Living Arrangements: Children (daughter) Available Help  at Discharge: Family;Available 24 hours/day Type of Home: House Home Access: Level entry (garage entry)       Home Layout: Two level (uses chair lift to get from  ground floor to 2nd floor) Home Equipment: Grab bars - tub/shower;Grab bars - toilet;Rolling Walker (2 wheels);Rollator (4 wheels);Cane - single point;Transport chair      Prior Function Prior Level of Function : Independent/Modified Independent;History of Falls (last six months)             Mobility Comments: uses RW, reports only this fall in the past 6 months after turning quickly. However, reports 8 falls in years prior ADLs Comments: daughter helps with bra and sweaters, daugher cooks, cleans, drives     Extremity/Trunk Assessment   Upper Extremity Assessment Upper Extremity Assessment: Defer to OT evaluation    Lower Extremity Assessment Lower Extremity Assessment: Generalized weakness;LLE deficits/detail LLE Deficits / Details: limited knee ext/flex and hip flexion due to pain, L LE bandaged    Cervical / Trunk Assessment Cervical / Trunk Assessment: Normal  Communication   Communication Communication: No apparent difficulties Cueing Techniques: Verbal cues  Cognition Arousal: Alert Behavior During Therapy: WFL for tasks assessed/performed Overall Cognitive Status: Within Functional Limits for tasks assessed       General Comments General comments (skin integrity, edema, etc.): on RA upon arrival, VSS throughout session. Pt preferred to not use spanish interpreter     PT Assessment Patient needs continued PT services  PT Problem List Decreased strength;Decreased range of motion;Decreased activity tolerance;Decreased balance;Decreased mobility;Decreased coordination;Decreased knowledge of use of DME;Decreased safety awareness       PT Treatment Interventions DME instruction;Gait training;Functional mobility training;Therapeutic activities;Therapeutic exercise;Balance training;Neuromuscular re-education;Patient/family education    PT Goals (Current goals can be found in the Care Plan section)  Acute Rehab PT Goals Patient Stated Goal: to get better PT Goal  Formulation: With patient Time For Goal Achievement: 07/16/23 Potential to Achieve Goals: Good    Frequency Min 1X/week        AM-PAC PT "6 Clicks" Mobility  Outcome Measure Help needed turning from your back to your side while in a flat bed without using bedrails?: A Little Help needed moving from lying on your back to sitting on the side of a flat bed without using bedrails?: A Lot Help needed moving to and from a bed to a chair (including a wheelchair)?: A Lot Help needed standing up from a chair using your arms (e.g., wheelchair or bedside chair)?: A Lot Help needed to walk in hospital room?: Total Help needed climbing 3-5 steps with a railing? : Total 6 Click Score: 11    End of Session Equipment Utilized During Treatment: Gait belt Activity Tolerance: Patient limited by pain Patient left: in bed;with call bell/phone within reach;with bed alarm set Nurse Communication: Mobility status PT Visit Diagnosis: Unsteadiness on feet (R26.81);History of falling (Z91.81);Muscle weakness (generalized) (M62.81)    Time: 6606-3016 PT Time Calculation (min) (ACUTE ONLY): 39 min   Charges:   PT Evaluation $PT Eval Low Complexity: 1 Low PT Treatments $Therapeutic Activity: 8-22 mins PT General Charges $$ ACUTE PT VISIT: 1 Visit         Hilton Cork, PT, DPT Secure Chat Preferred  Rehab Office [phone]   Arturo Morton Brion Aliment 07/02/2023, 5:39 PM

## 2023-07-02 NOTE — Op Note (Signed)
Orthopaedic Surgery Operative Note (CSN: [id number] ) Date of Surgery: 07/02/2023  Admit Date: 07/01/2023   Diagnoses: Pre-Op Diagnoses: Left periprosthetic supracondylar distal femur fracture   Post-Op Diagnosis: Same  Procedures: CPT 27511-Open reduction internal fixation of left supracondylar distal femur  Surgeons : Primary: Roby Lofts, MD  Assistant: Ulyses Southward, PA-C  Location: OR 3   Anesthesia: General   Antibiotics: Ancef 2g preop with 1 gm vancomycin powder placed topically   Tourniquet time:None    Estimated Blood Loss: 100 mL  Complications: None  Specimens:* No specimens in log *   Implants: Implant Name Type Inv. Item Serial No. Manufacturer Lot No. LRB No. Used Action  CAP LOCK NCB - [id number] Cap CAP LOCK NCB  [employer](ORTH,TRAU,BIO,SG)  Left 7 Implanted  PLATE DIST FEM 13K - [id number] Plate PLATE DIST FEM 66Y  [employer](ORTH,TRAU,BIO,SG)  Left 1 Implanted  SCREW NCB 5.0X75MM - [id number] Screw SCREW NCB 5.0X75MM  [employer](ORTH,TRAU,BIO,SG)  Left 3 Implanted  SCREW NCB 5.0X36MM - [id number] Screw SCREW NCB 5.0X36MM  [employer](ORTH,TRAU,BIO,SG)  Left 1 Implanted  SCREW 5.0 - [id number] Screw SCREW 5.0  [employer](ORTH,TRAU,BIO,SG)  Left 2 Implanted  SCREW 5.0 - [id number] Screw SCREW 5.0  [employer](ORTH,TRAU,BIO,SG)  Left 1 Implanted  SCREW NCB 4.0 - [id number] Screw SCREW NCB 4.0  [employer](ORTH,TRAU,BIO,SG)  Left 1 Implanted  SCREW 5.0 - [id number] Screw SCREW 5.0  [employer](ORTH,TRAU,BIO,SG)  Left 1 Implanted     Indications for Surgery: 82 year old female who sustained a ground-level fall.  Due to the unstable nature of her injury I recommend proceeding with open reduction internal fixation.  Risks and benefits were discussed with the patient and her daughter.  Risks include but not limited to bleeding, infection, malunion, nonunion, hardware failure, hardware rotation, nerve and  blood vessel injury, knee stiffness, DVT, even the possibility anesthetic complications.  She agreed to proceed with surgery and consent was obtained.  Operative Findings: Open reduction internal fixation of left periprosthetic distal femur fracture using [employer] NCB distal femoral locking plate.  Procedure: The patient was identified in the preoperative holding area. Consent was confirmed with the patient and their family and all questions were answered. The operative extremity was marked after confirmation with the patient. she was then brought back to the operating room by our anesthesia colleagues.  She was placed under general anesthetic and carefully transferred over to radiolucent flattop table.  A bump was placed under her operative hip.  The left lower extremity was then prepped and draped in usual sterile fashion.  A timeout was performed to verify the patient, the procedure, and the extremity.  Preoperative antibiotics were dosed.  The hip and knee were flexed over a triangle and fluoroscopic imaging showed the unstable nature of her injury.  A lateral approach to the distal femur was made and carried down through skin and subcutaneous tissue.  I incised through the IT band and expose the lateral condyle distal femur.  I mobilized the vastus lateralis to expose the metaphysis.  The fracture was aligned with traction and manipulation of the triangle.  I then slid a 12 hole [employer] NCB distal femoral locking plate along the lateral cortex of the femur.  I held it provisionally distally with a 2.0 mm K wire.  Percutaneous placement of 3.3 mm drill bit through the targeting arm was used to align the proximal portion of the plate.  I then drilled and placed  5.0 millimeter screws distally to bring the plate flush to bone.  I then placed 5.0 millimeter screws in the femoral shaft to align the proximal portion of the fracture.  I have replaced the 3.3 mm drill bit proximally with a 4.0  millimeter screw.  Locking caps were placed on the middle two screws in the femoral shaft.  I removed the targeting arm and placed a total of 5 screws distally.  Locking caps were placed on all of the distal screws.  Final fluoroscopic imaging was obtained.  The incision was copiously irrigated.  A gram vancomycin powder was placed into the incision.  A layered closure of 0 Vicryl, 2-0 Monocryl and 3-0 Monocryl Dermabond was used to close the skin.  Sterile dressings were applied.  The patient was then awoke from anesthesia and taken to the PACU in stable condition.  Post Op Plan/Instructions: The patient will be weightbearing as tolerated to the left lower extremity.  She will receive postoperative Ancef.  She will be placed on Lovenox for DVT prophylaxis and discharged on an oral DOAC.  Will have her mobilize with physical and Occupational Therapy.  I was present and performed the entire surgery.  Ulyses Southward, PA-C did assist me throughout the case. An assistant was necessary given the difficulty in approach, maintenance of reduction and ability to instrument the fracture.   Truitt Merle, MD [facility]

## 2023-07-02 NOTE — Consult Note (Signed)
Reason for Consult:Left distal femur fx Referring Physician: Marcellus Scott Time called: 1610 Time at bedside: 0850   Erica Day is an 82 y.o. female.  HPI: [name] was letting her dog out when she lost her balance and fell. She had immediate left knee pain and could not get up. She was brought to the ED where x-rays showed a left distal femur fx and orthopedic surgery was consulted. She lives at home with her daughter and ambulates with a RW.  Past Medical History:  Diagnosis Date   Arthritis    C. difficile colitis 01/2018   Depression    GERD (gastroesophageal reflux disease)    Hypertension    Lymphoma (HCC)    Sjogren's syndrome (HCC)    UTI (urinary tract infection) 01/2018    Past Surgical History:  Procedure Laterality Date   BREAST REDUCTION SURGERY     CHOLECYSTECTOMY     FOOT SURGERY     REPLACEMENT TOTAL KNEE BILATERAL     SHOULDER SURGERY     TONSILLECTOMY     TOTAL ABDOMINAL HYSTERECTOMY      History reviewed. No pertinent family history.  Social History:  reports that she has never smoked. She has never used smokeless tobacco. She reports that she does not drink alcohol and does not use drugs.  Allergies:  Allergies  Allergen Reactions   Benylin Cough [Diphenhydramine] Anaphylaxis and Shortness Of Breath   Codeine Nausea And Vomiting    Tolerates tramadol   Tape Other (See Comments)    Pt states makes her skin burn  Transparent dressing causes a rash per patient - Please use mepilex   Aloe Vera Rash    Turn to sores   Penicillins Rash   Sertraline Anxiety   Sulfa Antibiotics Rash    Medications: I have reviewed the patient's current medications.  Results for orders placed or performed during the hospital encounter of 07/01/23 (from the past 48 hour(s))  Basic metabolic panel     Status: Abnormal   Collection Time: 07/01/23  8:38 PM  Result Value Ref Range   Sodium 139 135 - 145 mmol/L   Potassium 4.1 3.5 - 5.1 mmol/L   Chloride 107 98 - 111  mmol/L   CO2 22 22 - 32 mmol/L   Glucose, Bld 166 (H) 70 - 99 mg/dL    Comment: Glucose reference range applies only to samples taken after fasting for at least 8 hours.   BUN 33 (H) 8 - 23 mg/dL   Creatinine, Ser 9.60 0.44 - 1.00 mg/dL   Calcium 9.0 8.9 - 45.4 mg/dL   GFR, Estimated >09 >81 mL/min    Comment: (NOTE) Calculated using the CKD-EPI Creatinine Equation (2021)    Anion gap 10 5 - 15    Comment: Performed at [facility], [address], [place], [place] [address]  ABO/Rh     Status: None   Collection Time: 07/01/23  8:38 PM  Result Value Ref Range   ABO/RH(D)      A POS Performed at [facility] [place] [facility], [address] [place], [place] [address]   Type and screen [facility]     Status: None   Collection Time: 07/01/23 10:03 PM  Result Value Ref Range   ABO/RH(D) A POS    Antibody Screen POS    Sample Expiration 07/02/2023,2359   Hepatic function panel     Status: Abnormal   Collection Time: 07/01/23 10:12 PM  Result Value Ref Range  Total Protein 5.8 (L) 6.5 - 8.1 g/dL   Albumin 3.5 3.5 - 5.0 g/dL   AST 41 15 - 41 U/L   ALT 34 0 - 44 U/L   Alkaline Phosphatase 25 (L) 38 - 126 U/L   Total Bilirubin 0.9 <1.2 mg/dL   Bilirubin, Direct 0.2 0.0 - 0.2 mg/dL   Indirect Bilirubin 0.7 0.3 - 0.9 mg/dL    Comment: Performed at [facility], [address], [place], [place] [address]  CK     Status: None   Collection Time: 07/01/23 10:12 PM  Result Value Ref Range   Total CK 179 38 - 234 U/L    Comment: Performed at [facility], [address], [place], [place] [address]  Magnesium     Status: None   Collection Time: 07/01/23 10:12 PM  Result Value Ref Range   Magnesium 1.8 1.7 - 2.4 mg/dL    Comment: Performed at [facility], [address] [place], [place] [address]  Phosphorus     Status: None   Collection Time: 07/01/23 10:12 PM  Result Value Ref Range   Phosphorus 4.2 2.5 - 4.6 mg/dL    Comment: Performed at [facility], [address] [place], [place] [address]  Protime-INR     Status: None   Collection Time: 07/01/23 10:12 PM  Result Value Ref Range   Prothrombin Time 14.3 11.4 - 15.2 seconds   INR 1.1 0.8 - 1.2    Comment: (NOTE) INR goal varies based on device and disease states. Performed at [facility], [address] [place], [place] [address]   TSH     Status: None   Collection Time: 07/01/23 10:12 PM  Result Value Ref Range   TSH 3.321 0.350 - 4.500 uIU/mL    Comment: Performed by a 3rd Generation assay with a functional sensitivity of <=0.01 uIU/mL. Performed at [facility], [address], [place], [place] [address]   Surgical PCR screen     Status: Abnormal   Collection Time: 07/02/23 12:18 AM   Specimen: Nasal Mucosa; Nasal Swab  Result Value Ref Range   MRSA, PCR NEGATIVE NEGATIVE   Staphylococcus aureus POSITIVE (A) NEGATIVE    Comment: (NOTE) The Xpert SA Assay (FDA approved for NASAL specimens in patients 52 years of age and older), is one component of a comprehensive surveillance program. It is not intended to diagnose infection nor to guide or monitor treatment. Performed at [facility], [address] [place], [place] [address]   Type and screen     Status: None (Preliminary result)   Collection Time: 07/02/23 12:39 AM  Result Value Ref Range   ABO/RH(D) A POS    Antibody Screen POS    Sample Expiration 07/05/2023,2359    Antibody Identification      NO CLINICALLY SIGNIFICANT ANTIBODY IDENTIFIED Performed at [facility], [address] [place], [place] [address]    Unit Number [id number]    Blood Component Type RED CELLS,LR    Unit division 00    Status of Unit ALLOCATED    Transfusion Status OK TO TRANSFUSE    Crossmatch Result COMPATIBLE    Unit Number [id number]    Blood Component Type RED CELLS,LR    Unit division 00    Status of Unit ALLOCATED    Transfusion Status OK TO TRANSFUSE    Crossmatch Result  COMPATIBLE   CBC with Differential/Platelet  Status: Abnormal   Collection Time: 07/02/23 12:50 AM  Result Value Ref Range   WBC 7.7 4.0 - 10.5 K/uL   RBC 4.22 3.87 - 5.11 MIL/uL   Hemoglobin 13.5 12.0 - 15.0 g/dL   HCT 28.4 13.2 - 44.0 %   MCV 95.5 80.0 - 100.0 fL   MCH 32.0 26.0 - 34.0 pg   MCHC 33.5 30.0 - 36.0 g/dL   RDW 10.2 72.5 - 36.6 %   Platelets 139 (L) 150 - 400 K/uL    Comment: REPEATED TO VERIFY   nRBC 0.0 0.0 - 0.2 %   Neutrophils Relative % 87 %   Neutro Abs 6.7 1.7 - 7.7 K/uL   Lymphocytes Relative 9 %   Lymphs Abs 0.7 0.7 - 4.0 K/uL   Monocytes Relative 4 %   Monocytes Absolute 0.3 0.1 - 1.0 K/uL   Eosinophils Relative 0 %   Eosinophils Absolute 0.0 0.0 - 0.5 K/uL   Basophils Relative 0 %   Basophils Absolute 0.0 0.0 - 0.1 K/uL   Immature Granulocytes 0 %   Abs Immature Granulocytes 0.03 0.00 - 0.07 K/uL    Comment: Performed at [facility], [address], [place], [place] [address]  Troponin I (High Sensitivity)     Status: Abnormal   Collection Time: 07/02/23 12:50 AM  Result Value Ref Range   Troponin I (High Sensitivity) 92 (H) <18 ng/L    Comment: (NOTE) Elevated high sensitivity troponin I (hsTnI) values and significant  changes across serial measurements may suggest ACS but many other  chronic and acute conditions are known to elevate hsTnI results.  Refer to the "Links" section for chest pain algorithms and additional  guidance. Performed at [facility], [address], [place], [place] [address]   Prealbumin     Status: None   Collection Time: 07/02/23  6:51 AM  Result Value Ref Range   Prealbumin 23 18 - 38 mg/dL    Comment: Performed at [facility], [address] [place], [place] [address]    DG CHEST PORT 1 VIEW  Result Date: 07/01/2023 CLINICAL DATA:  [id number] Preop cardiovascular exam [id number] EXAM: PORTABLE CHEST 1 VIEW COMPARISON:  Chest x-ray 02/07/2018 FINDINGS: Reversed right total shoulder arthroplasty. The  heart and mediastinal contours are within normal limits. Atherosclerotic plaque Right base atelectasis. Increased interstitial markings, peribronchial cuffing, Kerley B lines. No pleural effusion. No pneumothorax. No acute osseous abnormality. IMPRESSION: 1. Pulmonary edema. 2.  Aortic Atherosclerosis (ICD10-I70.0). Electronically Signed   By: Tish Frederickson M.D.   On: 07/01/2023 23:22   CT Head Wo Contrast  Result Date: 07/01/2023 CLINICAL DATA:  Head trauma, minor (Age >= 65y); Neck trauma (Age >= 65y) EXAM: CT HEAD WITHOUT CONTRAST CT CERVICAL SPINE WITHOUT CONTRAST TECHNIQUE: Multidetector CT imaging of the head and cervical spine was performed following the standard protocol without intravenous contrast. Multiplanar CT image reconstructions of the cervical spine were also generated. RADIATION DOSE REDUCTION: This exam was performed according to the departmental dose-optimization program which includes automated exposure control, adjustment of the mA and/or kV according to patient size and/or use of iterative reconstruction technique. COMPARISON:  None Available. FINDINGS: CT HEAD FINDINGS Brain: No hemorrhage. No hydrocephalus. No extra-axial fluid collection. No CT evidence of an acute cortical infarct. No mass effect. No mass lesion. There is sequela of moderate overall chronic microvascular ischemic change. 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  Skull: Normal. Negative for fracture or focal lesion. Sinuses/Orbits: No middle ear or mastoid effusion. Paranasal sinuses are clear. Bilateral lens replacement. Orbits are otherwise unremarkable. Other: None. CT CERVICAL SPINE FINDINGS Alignment: Grade 1 anterolisthesis of C5 on C6. Skull base and vertebrae: No acute fracture. There is a nonspecific hyperdense lesion in the C5 vertebral body level. Recommend comparison with prior imaging to exclude the possibility of osseous  metastatic disease Soft tissues and spinal canal: No prevertebral fluid or swelling. No visible canal hematoma. Disc levels:  No evidence of high-grade spinal canal stenosis. Upper chest: Negative. Other: None IMPRESSION: 1. No acute intracranial abnormality. 2. No acute fracture or traumatic subluxation of the cervical spine. 3. Nonspecific hyperdense lesion in the C5 vertebral body level. Recommend comparison with prior imaging to exclude the possibility of osseous metastatic disease. Electronically Signed   By: Lorenza Cambridge M.D.   On: 07/01/2023 19:36   CT Cervical Spine Wo Contrast  Result Date: 07/01/2023 CLINICAL DATA:  Head trauma, minor (Age >= 65y); Neck trauma (Age >= 65y) EXAM: CT HEAD WITHOUT CONTRAST CT CERVICAL SPINE WITHOUT CONTRAST TECHNIQUE: Multidetector CT imaging of the head and cervical spine was performed following the standard protocol without intravenous contrast. Multiplanar CT image reconstructions of the cervical spine were also generated. RADIATION DOSE REDUCTION: This exam was performed according to the departmental dose-optimization program which includes automated exposure control, adjustment of the mA and/or kV according to patient size and/or use of iterative reconstruction technique. COMPARISON:  None Available. FINDINGS: CT HEAD FINDINGS Brain: No hemorrhage. No hydrocephalus. No extra-axial fluid collection. No CT evidence of an acute cortical infarct. No mass effect. No mass lesion. There is sequela of moderate overall chronic microvascular ischemic change. 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 Skull: Normal. Negative for fracture or focal lesion. Sinuses/Orbits: No middle ear or mastoid effusion. Paranasal sinuses are clear. Bilateral lens replacement. Orbits are otherwise unremarkable. Other: None. CT CERVICAL SPINE FINDINGS Alignment: Grade 1 anterolisthesis of C5 on C6. Skull base and  vertebrae: No acute fracture. There is a nonspecific hyperdense lesion in the C5 vertebral body level. Recommend comparison with prior imaging to exclude the possibility of osseous metastatic disease Soft tissues and spinal canal: No prevertebral fluid or swelling. No visible canal hematoma. Disc levels:  No evidence of high-grade spinal canal stenosis. Upper chest: Negative. Other: None IMPRESSION: 1. No acute intracranial abnormality. 2. No acute fracture or traumatic subluxation of the cervical spine. 3. Nonspecific hyperdense lesion in the C5 vertebral body level. Recommend comparison with prior imaging to exclude the possibility of osseous metastatic disease. Electronically Signed   By: Lorenza Cambridge M.D.   On: 07/01/2023 19:36   DG Tibia/Fibula Left  Result Date: 07/01/2023 CLINICAL DATA:  Pain after fall. EXAM: LEFT TIBIA AND FIBULA - 2 VIEW COMPARISON:  None Available. FINDINGS: No evidence of lower leg fracture. Knee arthroplasty is intact were visualized. Distal femur fracture is partially included in the field of view. Ankle alignment is intact, distal most tibia and fibula not included in the AP view. Prominent vascular calcifications. IMPRESSION: No lower leg fracture. Electronically Signed   By: Narda Rutherford M.D.   On: 07/01/2023 19:33   DG Femur Min 2 Views Left  Result Date: 07/01/2023 CLINICAL DATA:  Left femoral deformity after fall, pain distally. EXAM: LEFT FEMUR 2 VIEWS COMPARISON:  None Available. FINDINGS: Comminuted, angulated and displaced distal femur fracture. No convincing fracture extension to  the knee arthroplasty. Femoral shaft and proximal femur are intact. Advanced vascular calcifications. IMPRESSION: Comminuted, angulated and displaced distal femur fracture. No convincing involvement of the knee arthroplasty Electronically Signed   By: Narda Rutherford M.D.   On: 07/01/2023 19:32   DG Pelvis 1-2 Views  Result Date: 07/01/2023 CLINICAL DATA:  Pain after fall. EXAM:  PELVIS - 1-2 VIEW COMPARISON:  None Available. FINDINGS: The bones are subjectively under mineralized. The cortical margins of the bony pelvis are intact. No fracture. Pubic symphysis and sacroiliac joints are congruent. Both femoral heads are well-seated in the respective acetabula. No hip dislocation. Mild bilateral hip osteoarthritis. Prominent vascular calcifications. IMPRESSION: 1. No pelvic fracture. 2. Bilateral hip osteoarthritis. Electronically Signed   By: Narda Rutherford M.D.   On: 07/01/2023 19:31    Review of Systems  HENT:  Negative for ear discharge, ear pain, hearing loss and tinnitus.   Eyes:  Negative for photophobia and pain.  Respiratory:  Negative for cough and shortness of breath.   Cardiovascular:  Negative for chest pain.  Gastrointestinal:  Negative for abdominal pain, nausea and vomiting.  Genitourinary:  Negative for dysuria, flank pain, frequency and urgency.  Musculoskeletal:  Positive for arthralgias (Left knee). Negative for back pain, myalgias and neck pain.  Neurological:  Negative for dizziness and headaches.  Hematological:  Does not bruise/bleed easily.  Psychiatric/Behavioral:  The patient is not nervous/anxious.    Blood pressure 127/65, pulse 67, temperature 98.8 F (37.1 C), temperature source Oral, resp. rate 17, height 5\' 7"  (1.702 m), weight 59 kg, SpO2 99%. Physical Exam Constitutional:      General: She is not in acute distress.    Appearance: She is well-developed. She is not diaphoretic.  HENT:     Head: Normocephalic and atraumatic.  Eyes:     General: No scleral icterus.       Right eye: No discharge.        Left eye: No discharge.     Conjunctiva/sclera: Conjunctivae normal.  Cardiovascular:     Rate and Rhythm: Normal rate and regular rhythm.  Pulmonary:     Effort: Pulmonary effort is normal. No respiratory distress.  Musculoskeletal:     Cervical back: Normal range of motion.     Comments: LLE No traumatic wounds, ecchymosis, or  rash  Mod TTP knee, in [place]  No ankle effusion  Sens DPN, SPN, TN intact  Motor EHL 5/5  DP 2+, No significant edema  Skin:    General: Skin is warm and dry.  Neurological:     Mental Status: She is alert.  Psychiatric:        Mood and Affect: Mood normal.        Behavior: Behavior normal.     Assessment/Plan: Left distal femur fx -- Plan ORIF by Dr. [name] this morning. Please keep NPO. Multiple medical problems including diffuse B cell lymphoma, Sjogren syndrome, anemia, hx/o C.dif, GERD, HTN, s/p bilateral knee replacements, and neuropathy -- per primary service    Freeman Caldron, PA-C Orthopedic Surgery [phone] 07/02/2023, 9:04 AM

## 2023-07-02 NOTE — TOC CAGE-AID Note (Signed)
Transition of Care [facility]) - CAGE-AID Screening  Patient Details  Name: Erica Day MRN: [id number] Date of Birth: 07-Oct-1940  Clinical Narrative:  Patient denies any current alcohol or drug use, no need for substance abuse resources at this time.  CAGE-AID Screening:   Have You Ever Felt You Ought to Cut Down on Your Drinking or Drug Use?: No Have People Annoyed You By Critizing Your Drinking Or Drug Use?: No Have You Felt Bad Or Guilty About Your Drinking Or Drug Use?: No Have You Ever Had a Drink or Used Drugs First Thing In The Morning to Steady Your Nerves or to Get Rid of a Hangover?: No CAGE-AID Score: 0  Substance Abuse Education Offered: No

## 2023-07-02 NOTE — Progress Notes (Addendum)
PROGRESS NOTE   Erica Day  UJW:[id number]    DOB: 09/19/1940    DOA: 07/01/2023  PCP: [facility]   I have briefly reviewed patients previous medical records in [facility].  Chief Complaint  Patient presents with   [facility] Course:  82 year old female, with her daughter, ambulates with the help of a cane or a walker, medical history significant for diffuse B-cell lymphoma, Sjogren syndrome, anemia, C. difficile, GERD, HTN, B/L TKR, presented to ED following a fall at home when she was walking with her walker, fell to the left side and noted left leg swelling and pain.  Admitted for left periprosthetic supracondylar distal femur fracture.  Orthopedics consulted and she underwent ORIF on 11/15.   Assessment & Plan:  Principal Problem:   Other fracture of left femur, initial encounter for closed fracture [facility]) Active Problems:   Sjogren's syndrome (HCC)   Fall   Cardiac murmur   Elevated BP without diagnosis of hypertension   Abnormal CT scan, cervical spine   Left periprosthetic supracondylar distal femur fracture.   Sustained after a ground-level mechanical fall at home Orthopedics consulted and she underwent ORIF on 11/15. As per orthopedics: WBAT on LLE, Lovenox for DVT prophylaxis while hospitalized and oral DOAC at discharge. Multimodality pain control.  Minimize opioids PT and OT evaluation.  Acute blood loss anemia Hemoglobin dropped from 13.5-11.5. EBL 100 mL Likely due to fracture and surgery. Follow CBC daily and transfuse if hemoglobin 7 g or less.  Thrombocytopenia Appears to be new. Possibly from acute blood loss. Follow CBC in AM.  Stage IIIb CKD Creatinine in Care Everywhere on 9/25: 1.24 and on 6/12: 1.32 Interestingly creatinine here was 0.93 on 11/14 and up to 1.27 Follow BMP.  Avoid nephrotoxics.  Mechanical fall CT head and C-spine without acute abnormalities. Other injuries as noted above. PT  and OT evaluation  Essential hypertension Controlled on metoprolol 50 Mg twice daily  Hyperlipidemia Continue Zocor  Sjogren's syndrome Stable.  Resume immunosuppressive's  Abnormal CT scan, cervical spine Radiology noted possible lesion could be secondary to metastatic disease.  Patient has known history of lymphoma although there will be less likely she has been actively followed by oncology.  Admitting MD discussed at length with family and recommended follow-up closely with oncology.  Patient may need repeat PET scan  Body mass index is 20.37 kg/m.  Nutritional Status Nutrition Problem: Inadequate oral intake Etiology: decreased appetite Signs/Symptoms: per patient/family report Interventions: Ensure Enlive (each supplement provides 350kcal and 20 grams of protein)   DVT prophylaxis: enoxaparin (LOVENOX) injection 40 mg Start: 07/03/23 0800 SCDs Start: 07/02/23 1331 SCDs Start: 07/01/23 2300     Code Status: Full Code:  Family Communication: None at bedside Disposition:  Status is: Inpatient Remains inpatient appropriate because: Immediate postop.     Consultants:   Orthopedics  Procedures:   As noted above  Antimicrobials:      Subjective:  Seen this morning prior to procedure.  History mostly as noted above.  Left knee pain on movement.  Reports that in the past when she has had bilateral knee replacements and right shoulder surgery, she did well with therapy and eventually went home and is hoping she can do the same thing this time.  States that she ambulates with the help of a cane or a walker or holding onto "furniture and walls" at home.  Wants to go back home to her dog.  Denies exertional  or rest dyspnea, chest pain, dizziness or lightheadedness.  Objective:   Vitals:   07/02/23 1245 07/02/23 1300 07/02/23 1315 07/02/23 1327  BP: (!) 161/71 (!) 149/79 (!) 145/60 (!) 150/70  Pulse: 79 79 75 72  Resp: 19 (!) 23 13 16   Temp:   97.7 F (36.5 C) (!) 97.5  F (36.4 C)  TempSrc:    Oral  SpO2: 96% 95% 97% 100%  Weight:      Height:        General exam: Elderly female, small built and moderately nourished lying comfortably propped up in bed without distress. Respiratory system: Clear to auscultation. Respiratory effort normal. Cardiovascular system: S1 & S2 heard, RRR. No JVD, rubs, gallops or clicks. No pedal edema.?  Soft systolic murmur at apex.  Telemetry personally reviewed: Sinus rhythm. Gastrointestinal system: Abdomen is nondistended, soft and nontender. No organomegaly or masses felt. Normal bowel sounds heard. Central nervous system: Alert and oriented. No focal neurological deficits. Extremities: Symmetric 5 x 5 power.  Left lower extremity was in an immobilizing splint. Skin: No rashes, lesions or ulcers Psychiatry: Judgement and insight appear normal. Mood & affect appropriate.     Data Reviewed:   I have personally reviewed following labs and imaging studies   CBC: Recent Labs  Lab 07/02/23 0050 07/02/23 1453  WBC 7.7 7.7  NEUTROABS 6.7  --   HGB 13.5 11.5*  HCT 40.3 35.8*  MCV 95.5 100.8*  PLT 139* 106*    Basic Metabolic Panel: Recent Labs  Lab 07/01/23 2038 07/01/23 2212 07/02/23 1453  NA 139  --   --   K 4.1  --   --   CL 107  --   --   CO2 22  --   --   GLUCOSE 166*  --   --   BUN 33*  --   --   CREATININE 0.93  --  1.27*  CALCIUM 9.0  --   --   MG  --  1.8  --   PHOS  --  4.2  --     Liver Function Tests: Recent Labs  Lab 07/01/23 2212  AST 41  ALT 34  ALKPHOS 25*  BILITOT 0.9  PROT 5.8*  ALBUMIN 3.5    CBG: No results for input(s): "GLUCAP" in the last 168 hours.  Microbiology Studies:   Recent Results (from the past 240 hour(s))  Surgical PCR screen     Status: Abnormal   Collection Time: 07/02/23 12:18 AM   Specimen: Nasal Mucosa; Nasal Swab  Result Value Ref Range Status   MRSA, PCR NEGATIVE NEGATIVE Final   Staphylococcus aureus POSITIVE (A) NEGATIVE Final    Comment:  (NOTE) The Xpert SA Assay (FDA approved for NASAL specimens in patients 38 years of age and older), is one component of a comprehensive surveillance program. It is not intended to diagnose infection nor to guide or monitor treatment. Performed at [facility], [address] [place], [place] [address]     Radiology Studies:  DG FEMUR PORT MIN 2 VIEWS LEFT  Result Date: 07/02/2023 CLINICAL DATA:  Postop. EXAM: LEFT FEMUR PORTABLE 2 VIEWS COMPARISON:  Preoperative imaging. FINDINGS: Lateral plate and multi screw fixation of comminuted distal femur fracture. Improved fracture alignment from preoperative imaging. Left knee arthroplasty is again seen. Recent postsurgical change includes air and edema in the soft tissues. There are prominent vascular calcifications. Fluffy calcifications projecting over the knee joint space are unchanged from preoperative exam. IMPRESSION:  ORIF of comminuted distal femur fracture. Electronically Signed   By: Narda Rutherford M.D.   On: 07/02/2023 13:42   DG FEMUR MIN 2 VIEWS LEFT  Result Date: 07/02/2023 CLINICAL DATA:  Elective surgery. EXAM: LEFT FEMUR 2 VIEWS COMPARISON:  Preoperative imaging FINDINGS: Seven fluoroscopic spot views of the left femur obtained in the operating room. Lateral plate and screw fixation of distal femur fracture. Previous knee arthroplasty. Fluoroscopy time 60.3 seconds. Dose 1.03 mGy. IMPRESSION: Intraoperative fluoroscopy during distal femur fracture fixation. Electronically Signed   By: Narda Rutherford M.D.   On: 07/02/2023 13:40   DG C-Arm 1-60 Min-No Report  Result Date: 07/02/2023 Fluoroscopy was utilized by the requesting physician.  No radiographic interpretation.   DG CHEST PORT 1 VIEW  Result Date: 07/01/2023 CLINICAL DATA:  [id number] Preop cardiovascular exam [id number] EXAM: PORTABLE CHEST 1 VIEW COMPARISON:  Chest x-ray 02/07/2018 FINDINGS: Reversed right total shoulder arthroplasty. The heart and mediastinal contours  are within normal limits. Atherosclerotic plaque Right base atelectasis. Increased interstitial markings, peribronchial cuffing, Kerley B lines. No pleural effusion. No pneumothorax. No acute osseous abnormality. IMPRESSION: 1. Pulmonary edema. 2.  Aortic Atherosclerosis (ICD10-I70.0). Electronically Signed   By: Tish Frederickson M.D.   On: 07/01/2023 23:22   CT Head Wo Contrast  Result Date: 07/01/2023 CLINICAL DATA:  Head trauma, minor (Age >= 65y); Neck trauma (Age >= 65y) EXAM: CT HEAD WITHOUT CONTRAST CT CERVICAL SPINE WITHOUT CONTRAST TECHNIQUE: Multidetector CT imaging of the head and cervical spine was performed following the standard protocol without intravenous contrast. Multiplanar CT image reconstructions of the cervical spine were also generated. RADIATION DOSE REDUCTION: This exam was performed according to the departmental dose-optimization program which includes automated exposure control, adjustment of the mA and/or kV according to patient size and/or use of iterative reconstruction technique. COMPARISON:  None Available. FINDINGS: CT HEAD FINDINGS Brain: No hemorrhage. No hydrocephalus. No extra-axial fluid collection. No CT evidence of an acute cortical infarct. No mass effect. No mass lesion. There is sequela of moderate overall chronic microvascular ischemic change. 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 Skull: Normal. Negative for fracture or focal lesion. Sinuses/Orbits: No middle ear or mastoid effusion. Paranasal sinuses are clear. Bilateral lens replacement. Orbits are otherwise unremarkable. Other: None. CT CERVICAL SPINE FINDINGS Alignment: Grade 1 anterolisthesis of C5 on C6. Skull base and vertebrae: No acute fracture. There is a nonspecific hyperdense lesion in the C5 vertebral body level. Recommend comparison with prior imaging to exclude the possibility of osseous metastatic disease Soft tissues and  spinal canal: No prevertebral fluid or swelling. No visible canal hematoma. Disc levels:  No evidence of high-grade spinal canal stenosis. Upper chest: Negative. Other: None IMPRESSION: 1. No acute intracranial abnormality. 2. No acute fracture or traumatic subluxation of the cervical spine. 3. Nonspecific hyperdense lesion in the C5 vertebral body level. Recommend comparison with prior imaging to exclude the possibility of osseous metastatic disease. Electronically Signed   By: Lorenza Cambridge M.D.   On: 07/01/2023 19:36   CT Cervical Spine Wo Contrast  Result Date: 07/01/2023 CLINICAL DATA:  Head trauma, minor (Age >= 65y); Neck trauma (Age >= 65y) EXAM: CT HEAD WITHOUT CONTRAST CT CERVICAL SPINE WITHOUT CONTRAST TECHNIQUE: Multidetector CT imaging of the head and cervical spine was performed following the standard protocol without intravenous contrast. Multiplanar CT image reconstructions of the cervical spine were also generated. RADIATION DOSE REDUCTION: This exam was performed according to the departmental  dose-optimization program which includes automated exposure control, adjustment of the mA and/or kV according to patient size and/or use of iterative reconstruction technique. COMPARISON:  None Available. FINDINGS: CT HEAD FINDINGS Brain: No hemorrhage. No hydrocephalus. No extra-axial fluid collection. No CT evidence of an acute cortical infarct. No mass effect. No mass lesion. There is sequela of moderate overall chronic microvascular ischemic change. 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 Skull: Normal. Negative for fracture or focal lesion. Sinuses/Orbits: No middle ear or mastoid effusion. Paranasal sinuses are clear. Bilateral lens replacement. Orbits are otherwise unremarkable. Other: None. CT CERVICAL SPINE FINDINGS Alignment: Grade 1 anterolisthesis of C5 on C6. Skull base and vertebrae: No acute fracture. There is a  nonspecific hyperdense lesion in the C5 vertebral body level. Recommend comparison with prior imaging to exclude the possibility of osseous metastatic disease Soft tissues and spinal canal: No prevertebral fluid or swelling. No visible canal hematoma. Disc levels:  No evidence of high-grade spinal canal stenosis. Upper chest: Negative. Other: None IMPRESSION: 1. No acute intracranial abnormality. 2. No acute fracture or traumatic subluxation of the cervical spine. 3. Nonspecific hyperdense lesion in the C5 vertebral body level. Recommend comparison with prior imaging to exclude the possibility of osseous metastatic disease. Electronically Signed   By: Lorenza Cambridge M.D.   On: 07/01/2023 19:36   DG Tibia/Fibula Left  Result Date: 07/01/2023 CLINICAL DATA:  Pain after fall. EXAM: LEFT TIBIA AND FIBULA - 2 VIEW COMPARISON:  None Available. FINDINGS: No evidence of lower leg fracture. Knee arthroplasty is intact were visualized. Distal femur fracture is partially included in the field of view. Ankle alignment is intact, distal most tibia and fibula not included in the AP view. Prominent vascular calcifications. IMPRESSION: No lower leg fracture. Electronically Signed   By: Narda Rutherford M.D.   On: 07/01/2023 19:33   DG Femur Min 2 Views Left  Result Date: 07/01/2023 CLINICAL DATA:  Left femoral deformity after fall, pain distally. EXAM: LEFT FEMUR 2 VIEWS COMPARISON:  None Available. FINDINGS: Comminuted, angulated and displaced distal femur fracture. No convincing fracture extension to the knee arthroplasty. Femoral shaft and proximal femur are intact. Advanced vascular calcifications. IMPRESSION: Comminuted, angulated and displaced distal femur fracture. No convincing involvement of the knee arthroplasty Electronically Signed   By: Narda Rutherford M.D.   On: 07/01/2023 19:32   DG Pelvis 1-2 Views  Result Date: 07/01/2023 CLINICAL DATA:  Pain after fall. EXAM: PELVIS - 1-2 VIEW COMPARISON:  None  Available. FINDINGS: The bones are subjectively under mineralized. The cortical margins of the bony pelvis are intact. No fracture. Pubic symphysis and sacroiliac joints are congruent. Both femoral heads are well-seated in the respective acetabula. No hip dislocation. Mild bilateral hip osteoarthritis. Prominent vascular calcifications. IMPRESSION: 1. No pelvic fracture. 2. Bilateral hip osteoarthritis. Electronically Signed   By: Narda Rutherford M.D.   On: 07/01/2023 19:31    Scheduled Meds:    docusate sodium  100 mg Oral BID   [START ON 07/03/2023] enoxaparin (LOVENOX) injection  40 mg Subcutaneous Q24H   [START ON 07/03/2023] feeding supplement  237 mL Oral BID BM   gabapentin  400 mg Oral QHS   metoprolol tartrate  50 mg Oral BID   mupirocin ointment  1 Application Nasal BID   simvastatin  20 mg Oral QHS    Continuous Infusions:     ceFAZolin (ANCEF) IV     tranexamic acid  LOS: 1 day     Marcellus Scott, MD,  FACP, [facility], [facility], [facility]   [facility] [place]      To contact the attending provider between 7A-7P or the covering provider during after hours 7P-7A, please log into the web site [email or web address] and access using universal [place] password for that web site. If you do not have the password, please call the hospital operator.  07/02/2023, 4:31 PM

## 2023-07-02 NOTE — H&P (View-Only) (Signed)
Reason for Consult:Left distal femur fx Referring Physician: Marcellus Scott Time called: 1610 Time at bedside: 0850   Erica Day is an 82 y.o. female.  HPI: Erica Day was letting her dog out when she lost her balance and fell. She had immediate left knee pain and could not get up. She was brought to the ED where x-rays showed a left distal femur fx and orthopedic surgery was consulted. She lives at home with her daughter and ambulates with a RW.  Past Medical History:  Diagnosis Date   Arthritis    C. difficile colitis 01/2018   Depression    GERD (gastroesophageal reflux disease)    Hypertension    Lymphoma (HCC)    Sjogren's syndrome (HCC)    UTI (urinary tract infection) 01/2018    Past Surgical History:  Procedure Laterality Date   BREAST REDUCTION SURGERY     CHOLECYSTECTOMY     FOOT SURGERY     REPLACEMENT TOTAL KNEE BILATERAL     SHOULDER SURGERY     TONSILLECTOMY     TOTAL ABDOMINAL HYSTERECTOMY      History reviewed. No pertinent family history.  Social History:  reports that she has never smoked. She has never used smokeless tobacco. She reports that she does not drink alcohol and does not use drugs.  Allergies:  Allergies  Allergen Reactions   Benylin Cough [Diphenhydramine] Anaphylaxis and Shortness Of Breath   Codeine Nausea And Vomiting    Tolerates tramadol   Tape Other (See Comments)    Pt states makes her skin burn  Transparent dressing causes a rash per patient - Please use mepilex   Aloe Vera Rash    Turn to sores   Penicillins Rash   Sertraline Anxiety   Sulfa Antibiotics Rash    Medications: I have reviewed the patient's current medications.  Results for orders placed or performed during the hospital encounter of 07/01/23 (from the past 48 hour(s))  Basic metabolic panel     Status: Abnormal   Collection Time: 07/01/23  8:38 PM  Result Value Ref Range   Sodium 139 135 - 145 mmol/L   Potassium 4.1 3.5 - 5.1 mmol/L   Chloride 107 98 - 111  mmol/L   CO2 22 22 - 32 mmol/L   Glucose, Bld 166 (H) 70 - 99 mg/dL    Comment: Glucose reference range applies only to samples taken after fasting for at least 8 hours.   BUN 33 (H) 8 - 23 mg/dL   Creatinine, Ser 9.60 0.44 - 1.00 mg/dL   Calcium 9.0 8.9 - 45.4 mg/dL   GFR, Estimated >09 >81 mL/min    Comment: (NOTE) Calculated using the CKD-EPI Creatinine Equation (2021)    Anion gap 10 5 - 15    Comment: Performed at [facility], [address], [place], [place] [address]  ABO/Rh     Status: None   Collection Time: 07/01/23  8:38 PM  Result Value Ref Range   ABO/RH(D)      A POS Performed at [facility] [place] [facility], [address] [place], [place] [address]   Type and screen [facility]     Status: None   Collection Time: 07/01/23 10:03 PM  Result Value Ref Range   ABO/RH(D) A POS    Antibody Screen POS    Sample Expiration 07/02/2023,2359   Hepatic function panel     Status: Abnormal   Collection Time: 07/01/23 10:12 PM  Result Value Ref Range  Total Protein 5.8 (L) 6.5 - 8.1 g/dL   Albumin 3.5 3.5 - 5.0 g/dL   AST 41 15 - 41 U/L   ALT 34 0 - 44 U/L   Alkaline Phosphatase 25 (L) 38 - 126 U/L   Total Bilirubin 0.9 <1.2 mg/dL   Bilirubin, Direct 0.2 0.0 - 0.2 mg/dL   Indirect Bilirubin 0.7 0.3 - 0.9 mg/dL    Comment: Performed at [facility], [address], [place], [place] [address]  CK     Status: None   Collection Time: 07/01/23 10:12 PM  Result Value Ref Range   Total CK 179 38 - 234 U/L    Comment: Performed at [facility], [address], [place], [place] [address]  Magnesium     Status: None   Collection Time: 07/01/23 10:12 PM  Result Value Ref Range   Magnesium 1.8 1.7 - 2.4 mg/dL    Comment: Performed at [facility], [address] [place], [place] [address]  Phosphorus     Status: None   Collection Time: 07/01/23 10:12 PM  Result Value Ref Range   Phosphorus 4.2 2.5 - 4.6 mg/dL    Comment: Performed at [facility], [address] [place], [place] [address]  Protime-INR     Status: None   Collection Time: 07/01/23 10:12 PM  Result Value Ref Range   Prothrombin Time 14.3 11.4 - 15.2 seconds   INR 1.1 0.8 - 1.2    Comment: (NOTE) INR goal varies based on device and disease states. Performed at [facility], [address] [place], [place] [address]   TSH     Status: None   Collection Time: 07/01/23 10:12 PM  Result Value Ref Range   TSH 3.321 0.350 - 4.500 uIU/mL    Comment: Performed by a 3rd Generation assay with a functional sensitivity of <=0.01 uIU/mL. Performed at [facility], [address], [place], [place] [address]   Surgical PCR screen     Status: Abnormal   Collection Time: 07/02/23 12:18 AM   Specimen: Nasal Mucosa; Nasal Swab  Result Value Ref Range   MRSA, PCR NEGATIVE NEGATIVE   Staphylococcus aureus POSITIVE (A) NEGATIVE    Comment: (NOTE) The Xpert SA Assay (FDA approved for NASAL specimens in patients 52 years of age and older), is one component of a comprehensive surveillance program. It is not intended to diagnose infection nor to guide or monitor treatment. Performed at [facility], [address] [place], [place] [address]   Type and screen     Status: None (Preliminary result)   Collection Time: 07/02/23 12:39 AM  Result Value Ref Range   ABO/RH(D) A POS    Antibody Screen POS    Sample Expiration 07/05/2023,2359    Antibody Identification      NO CLINICALLY SIGNIFICANT ANTIBODY IDENTIFIED Performed at [facility], [address] [place], [place] [address]    Unit Number [id number]    Blood Component Type RED CELLS,LR    Unit division 00    Status of Unit ALLOCATED    Transfusion Status OK TO TRANSFUSE    Crossmatch Result COMPATIBLE    Unit Number [id number]    Blood Component Type RED CELLS,LR    Unit division 00    Status of Unit ALLOCATED    Transfusion Status OK TO TRANSFUSE    Crossmatch Result  COMPATIBLE   CBC with Differential/Platelet  Status: Abnormal   Collection Time: 07/02/23 12:50 AM  Result Value Ref Range   WBC 7.7 4.0 - 10.5 K/uL   RBC 4.22 3.87 - 5.11 MIL/uL   Hemoglobin 13.5 12.0 - 15.0 g/dL   HCT 28.4 13.2 - 44.0 %   MCV 95.5 80.0 - 100.0 fL   MCH 32.0 26.0 - 34.0 pg   MCHC 33.5 30.0 - 36.0 g/dL   RDW 10.2 72.5 - 36.6 %   Platelets 139 (L) 150 - 400 K/uL    Comment: REPEATED TO VERIFY   nRBC 0.0 0.0 - 0.2 %   Neutrophils Relative % 87 %   Neutro Abs 6.7 1.7 - 7.7 K/uL   Lymphocytes Relative 9 %   Lymphs Abs 0.7 0.7 - 4.0 K/uL   Monocytes Relative 4 %   Monocytes Absolute 0.3 0.1 - 1.0 K/uL   Eosinophils Relative 0 %   Eosinophils Absolute 0.0 0.0 - 0.5 K/uL   Basophils Relative 0 %   Basophils Absolute 0.0 0.0 - 0.1 K/uL   Immature Granulocytes 0 %   Abs Immature Granulocytes 0.03 0.00 - 0.07 K/uL    Comment: Performed at [facility], [address], [place], [place] [address]  Troponin I (High Sensitivity)     Status: Abnormal   Collection Time: 07/02/23 12:50 AM  Result Value Ref Range   Troponin I (High Sensitivity) 92 (H) <18 ng/L    Comment: (NOTE) Elevated high sensitivity troponin I (hsTnI) values and significant  changes across serial measurements may suggest ACS but many other  chronic and acute conditions are known to elevate hsTnI results.  Refer to the "Links" section for chest pain algorithms and additional  guidance. Performed at [facility], [address], [place], [place] [address]   Prealbumin     Status: None   Collection Time: 07/02/23  6:51 AM  Result Value Ref Range   Prealbumin 23 18 - 38 mg/dL    Comment: Performed at [facility], [address] [place], [place] [address]    DG CHEST PORT 1 VIEW  Result Date: 07/01/2023 CLINICAL DATA:  [id number] Preop cardiovascular exam [id number] EXAM: PORTABLE CHEST 1 VIEW COMPARISON:  Chest x-ray 02/07/2018 FINDINGS: Reversed right total shoulder arthroplasty. The  heart and mediastinal contours are within normal limits. Atherosclerotic plaque Right base atelectasis. Increased interstitial markings, peribronchial cuffing, Kerley B lines. No pleural effusion. No pneumothorax. No acute osseous abnormality. IMPRESSION: 1. Pulmonary edema. 2.  Aortic Atherosclerosis (ICD10-I70.0). Electronically Signed   By: Tish Frederickson M.D.   On: 07/01/2023 23:22   CT Head Wo Contrast  Result Date: 07/01/2023 CLINICAL DATA:  Head trauma, minor (Age >= 65y); Neck trauma (Age >= 65y) EXAM: CT HEAD WITHOUT CONTRAST CT CERVICAL SPINE WITHOUT CONTRAST TECHNIQUE: Multidetector CT imaging of the head and cervical spine was performed following the standard protocol without intravenous contrast. Multiplanar CT image reconstructions of the cervical spine were also generated. RADIATION DOSE REDUCTION: This exam was performed according to the departmental dose-optimization program which includes automated exposure control, adjustment of the mA and/or kV according to patient size and/or use of iterative reconstruction technique. COMPARISON:  None Available. FINDINGS: CT HEAD FINDINGS Brain: No hemorrhage. No hydrocephalus. No extra-axial fluid collection. No CT evidence of an acute cortical infarct. No mass effect. No mass lesion. There is sequela of moderate overall chronic microvascular ischemic change. 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  Skull: Normal. Negative for fracture or focal lesion. Sinuses/Orbits: No middle ear or mastoid effusion. Paranasal sinuses are clear. Bilateral lens replacement. Orbits are otherwise unremarkable. Other: None. CT CERVICAL SPINE FINDINGS Alignment: Grade 1 anterolisthesis of C5 on C6. Skull base and vertebrae: No acute fracture. There is a nonspecific hyperdense lesion in the C5 vertebral body level. Recommend comparison with prior imaging to exclude the possibility of osseous  metastatic disease Soft tissues and spinal canal: No prevertebral fluid or swelling. No visible canal hematoma. Disc levels:  No evidence of high-grade spinal canal stenosis. Upper chest: Negative. Other: None IMPRESSION: 1. No acute intracranial abnormality. 2. No acute fracture or traumatic subluxation of the cervical spine. 3. Nonspecific hyperdense lesion in the C5 vertebral body level. Recommend comparison with prior imaging to exclude the possibility of osseous metastatic disease. Electronically Signed   By: Lorenza Cambridge M.D.   On: 07/01/2023 19:36   CT Cervical Spine Wo Contrast  Result Date: 07/01/2023 CLINICAL DATA:  Head trauma, minor (Age >= 65y); Neck trauma (Age >= 65y) EXAM: CT HEAD WITHOUT CONTRAST CT CERVICAL SPINE WITHOUT CONTRAST TECHNIQUE: Multidetector CT imaging of the head and cervical spine was performed following the standard protocol without intravenous contrast. Multiplanar CT image reconstructions of the cervical spine were also generated. RADIATION DOSE REDUCTION: This exam was performed according to the departmental dose-optimization program which includes automated exposure control, adjustment of the mA and/or kV according to patient size and/or use of iterative reconstruction technique. COMPARISON:  None Available. FINDINGS: CT HEAD FINDINGS Brain: No hemorrhage. No hydrocephalus. No extra-axial fluid collection. No CT evidence of an acute cortical infarct. No mass effect. No mass lesion. There is sequela of moderate overall chronic microvascular ischemic change. 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 Skull: Normal. Negative for fracture or focal lesion. Sinuses/Orbits: No middle ear or mastoid effusion. Paranasal sinuses are clear. Bilateral lens replacement. Orbits are otherwise unremarkable. Other: None. CT CERVICAL SPINE FINDINGS Alignment: Grade 1 anterolisthesis of C5 on C6. Skull base and  vertebrae: No acute fracture. There is a nonspecific hyperdense lesion in the C5 vertebral body level. Recommend comparison with prior imaging to exclude the possibility of osseous metastatic disease Soft tissues and spinal canal: No prevertebral fluid or swelling. No visible canal hematoma. Disc levels:  No evidence of high-grade spinal canal stenosis. Upper chest: Negative. Other: None IMPRESSION: 1. No acute intracranial abnormality. 2. No acute fracture or traumatic subluxation of the cervical spine. 3. Nonspecific hyperdense lesion in the C5 vertebral body level. Recommend comparison with prior imaging to exclude the possibility of osseous metastatic disease. Electronically Signed   By: Lorenza Cambridge M.D.   On: 07/01/2023 19:36   DG Tibia/Fibula Left  Result Date: 07/01/2023 CLINICAL DATA:  Pain after fall. EXAM: LEFT TIBIA AND FIBULA - 2 VIEW COMPARISON:  None Available. FINDINGS: No evidence of lower leg fracture. Knee arthroplasty is intact were visualized. Distal femur fracture is partially included in the field of view. Ankle alignment is intact, distal most tibia and fibula not included in the AP view. Prominent vascular calcifications. IMPRESSION: No lower leg fracture. Electronically Signed   By: Narda Rutherford M.D.   On: 07/01/2023 19:33   DG Femur Min 2 Views Left  Result Date: 07/01/2023 CLINICAL DATA:  Left femoral deformity after fall, pain distally. EXAM: LEFT FEMUR 2 VIEWS COMPARISON:  None Available. FINDINGS: Comminuted, angulated and displaced distal femur fracture. No convincing fracture extension to  the knee arthroplasty. Femoral shaft and proximal femur are intact. Advanced vascular calcifications. IMPRESSION: Comminuted, angulated and displaced distal femur fracture. No convincing involvement of the knee arthroplasty Electronically Signed   By: Narda Rutherford M.D.   On: 07/01/2023 19:32   DG Pelvis 1-2 Views  Result Date: 07/01/2023 CLINICAL DATA:  Pain after fall. EXAM:  PELVIS - 1-2 VIEW COMPARISON:  None Available. FINDINGS: The bones are subjectively under mineralized. The cortical margins of the bony pelvis are intact. No fracture. Pubic symphysis and sacroiliac joints are congruent. Both femoral heads are well-seated in the respective acetabula. No hip dislocation. Mild bilateral hip osteoarthritis. Prominent vascular calcifications. IMPRESSION: 1. No pelvic fracture. 2. Bilateral hip osteoarthritis. Electronically Signed   By: Narda Rutherford M.D.   On: 07/01/2023 19:31    Review of Systems  HENT:  Negative for ear discharge, ear pain, hearing loss and tinnitus.   Eyes:  Negative for photophobia and pain.  Respiratory:  Negative for cough and shortness of breath.   Cardiovascular:  Negative for chest pain.  Gastrointestinal:  Negative for abdominal pain, nausea and vomiting.  Genitourinary:  Negative for dysuria, flank pain, frequency and urgency.  Musculoskeletal:  Positive for arthralgias (Left knee). Negative for back pain, myalgias and neck pain.  Neurological:  Negative for dizziness and headaches.  Hematological:  Does not bruise/bleed easily.  Psychiatric/Behavioral:  The patient is not nervous/anxious.    Blood pressure 127/65, pulse 67, temperature 98.8 F (37.1 C), temperature source Oral, resp. rate 17, height 5\' 7"  (1.702 m), weight 59 kg, SpO2 99%. Physical Exam Constitutional:      General: She is not in acute distress.    Appearance: She is well-developed. She is not diaphoretic.  HENT:     Head: Normocephalic and atraumatic.  Eyes:     General: No scleral icterus.       Right eye: No discharge.        Left eye: No discharge.     Conjunctiva/sclera: Conjunctivae normal.  Cardiovascular:     Rate and Rhythm: Normal rate and regular rhythm.  Pulmonary:     Effort: Pulmonary effort is normal. No respiratory distress.  Musculoskeletal:     Cervical back: Normal range of motion.     Comments: LLE No traumatic wounds, ecchymosis, or  rash  Mod TTP knee, in [place]  No ankle effusion  Sens DPN, SPN, TN intact  Motor EHL 5/5  DP 2+, No significant edema  Skin:    General: Skin is warm and dry.  Neurological:     Mental Status: She is alert.  Psychiatric:        Mood and Affect: Mood normal.        Behavior: Behavior normal.     Assessment/Plan: Left distal femur fx -- Plan ORIF by Dr. [name] this morning. Please keep NPO. Multiple medical problems including diffuse B cell lymphoma, Sjogren syndrome, anemia, hx/o C.dif, GERD, HTN, s/p bilateral knee replacements, and neuropathy -- per primary service    Freeman Caldron, PA-C Orthopedic Surgery [phone] 07/02/2023, 9:04 AM

## 2023-07-02 NOTE — Transfer of Care (Signed)
Immediate Anesthesia Transfer of Care Note  Patient: Erica Day  Procedure(s) Performed: OPEN REDUCTION INTERNAL FIXATION (ORIF) DISTAL FEMUR FRACTURE (Left: Leg Upper)  Patient Location: PACU  Anesthesia Type:General  Level of Consciousness: awake and alert   Airway & Oxygen Therapy: Patient Spontanous Breathing  Post-op Assessment: Report given to RN and Post -op Vital signs reviewed and stable  Post vital signs: Reviewed and stable  Last Vitals:  Vitals Value Taken Time  BP 170/90 07/02/23 1218  Temp    Pulse 95 07/02/23 1220  Resp 26 07/02/23 1220  SpO2 95 % 07/02/23 1220  Vitals shown include unfiled device data.  Last Pain:  Vitals:   07/02/23 1022  TempSrc:   PainSc: 0-No pain         Complications: No notable events documented.

## 2023-07-02 NOTE — Anesthesia Procedure Notes (Signed)
Procedure Name: Intubation Date/Time: 07/02/2023 11:03 AM  Performed by: Sandie Ano, CRNAPre-anesthesia Checklist: Patient identified, Emergency Drugs available, Suction available and Patient being monitored Patient Re-evaluated:Patient Re-evaluated prior to induction Oxygen Delivery Method: Circle System Utilized Preoxygenation: Pre-oxygenation with 100% oxygen Induction Type: IV induction Ventilation: Mask ventilation without difficulty Laryngoscope Size: Mac and 3 Grade View: Grade II Tube type: Oral Tube size: 7.0 mm Number of attempts: 1 Airway Equipment and Method: Stylet and Oral airway Placement Confirmation: ETT inserted through vocal cords under direct vision, positive ETCO2 and breath sounds checked- equal and bilateral Secured at: 22 cm Tube secured with: Tape Dental Injury: Teeth and Oropharynx as per pre-operative assessment

## 2023-07-02 NOTE — Progress Notes (Signed)
Initial Nutrition Assessment  DOCUMENTATION CODES:   Not applicable  INTERVENTION:   -Continue heart healthy diet if well consumed. If intakes trend <75% would recommend to liberalize.  -Provide extra gravy all meals due to Sjogren's syndrome.  -Provide Ensure Enlive po BID, each supplement provides 350 kcal and 20 grams of protein.   NUTRITION DIAGNOSIS:   Inadequate oral intake related to decreased appetite as evidenced by per patient/family report.   GOAL:   Patient will meet greater than or equal to 90% of their needs    MONITOR:   PO intake, Weight trends, Supplement acceptance, Skin, Labs  REASON FOR ASSESSMENT:   Consult Assessment of nutrition requirement/status  ASSESSMENT: 82 y/o female presented after a fall. PMH: diffuse B cell lymphoma, Sjogren's syndrome, anemia, hx of C.diff, GERD, HTN, neuropathy, hx of multiple falls, HLD, cholecystectomy.   11/15-postop open reduction internal fixation of left supracondylar distal femur.  Patient speaks Spanish and some [place], understands [place], family at bedside to help. Patient notes appetite has not been very good lately since the fall. She follows a low salt and low sugar diet at home. Avoids milk to drink and eggs, causes GI upset, okay if baked into recipes-communicated in menu software. 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 Patient and family know the food textures she must avoid which cause her problems. Family is going to be ordering the patient's meals.  Agrees to Ensure while appetite is reduced.  Stated usual weight ~125#. She had weight loss a few years ago, none recent.  Medications reviewed and include LR @ 10 mL/hr. Labs reviewed   Diet Order:  advanced to heart healthy Diet Order             Diet Heart Room service appropriate? Yes; Fluid consistency: Thin  Diet effective now                    EDUCATION NEEDS:   Not appropriate for education at this time  Skin:  Skin Assessment: Skin Integrity Issues: Skin Integrity Issues:: Incisions Incisions: left leg  Last BM:  06/30/23  Height:   Ht Readings from Last 1 Encounters:  07/01/23 5\' 7"  (1.702 m)    Weight:   Wt Readings from Last 1 Encounters:  07/01/23 59 kg    Ideal Body Weight:  63.6 kg  BMI:  Body mass index is 20.37 kg/m.  Estimated Nutritional Needs:   Kcal:  1600-1800 kcal/day  Protein:  83-95 gm/day  Fluid:  1600-1800 mL/day    Alvino Chapel, RDLD Clinical Dietitian See AMION for contact information

## 2023-07-02 NOTE — Anesthesia Procedure Notes (Signed)
Anesthesia Regional Block: Femoral nerve block   Pre-Anesthetic Checklist: , timeout performed,  Correct Patient, Correct Site, Correct Laterality,  Correct Procedure, Correct Position, site marked,  Risks and benefits discussed,  Surgical consent,  Pre-op evaluation,  At surgeon's request and post-op pain management  Laterality: Lower and Left  Prep: chloraprep       Needles:  Injection technique: Single-shot  Needle Type: Stimulator Needle - 80     Needle Length: 9cm  Needle Gauge: 22   Needle insertion depth: 6 cm   Additional Needles:   Procedures:,,,, ultrasound used (permanent image in chart),,     Nerve Stimulator or Paresthesia:  Response: Patellar snap, 0.5 mA  Additional Responses:   Narrative:  Start time: 07/02/2023 10:07 AM End time: 07/02/2023 10:27 AM Injection made incrementally with aspirations every 5 mL.  Performed by: Personally  Anesthesiologist: Lewie Loron, MD  Additional Notes: BP cuff, EKG monitors applied. Sedation begun. Femoral artery palpated for location of nerve. After nerve location verified with U/S, anesthetic injected incrementally, slowly, and after negative aspirations under direct u/s guidance. Good perineural spread. Patient tolerated well.

## 2023-07-03 ENCOUNTER — Inpatient Hospital Stay (HOSPITAL_COMMUNITY): Payer: Medicare Other

## 2023-07-03 DIAGNOSIS — D696 Thrombocytopenia, unspecified: Secondary | ICD-10-CM | POA: Diagnosis not present

## 2023-07-03 DIAGNOSIS — W19XXXD Unspecified fall, subsequent encounter: Secondary | ICD-10-CM | POA: Diagnosis not present

## 2023-07-03 DIAGNOSIS — D62 Acute posthemorrhagic anemia: Secondary | ICD-10-CM | POA: Diagnosis not present

## 2023-07-03 DIAGNOSIS — M978XXA Periprosthetic fracture around other internal prosthetic joint, initial encounter: Secondary | ICD-10-CM | POA: Diagnosis not present

## 2023-07-03 DIAGNOSIS — R011 Cardiac murmur, unspecified: Secondary | ICD-10-CM

## 2023-07-03 LAB — BASIC METABOLIC PANEL
Anion gap: 10 (ref 5–15)
BUN: 28 mg/dL — ABNORMAL HIGH (ref 8–23)
CO2: 26 mmol/L (ref 22–32)
Calcium: 8.8 mg/dL — ABNORMAL LOW (ref 8.9–10.3)
Chloride: 105 mmol/L (ref 98–111)
Creatinine, Ser: 1.07 mg/dL — ABNORMAL HIGH (ref 0.44–1.00)
GFR, Estimated: 51 mL/min — ABNORMAL LOW (ref >60)
Glucose, Bld: 136 mg/dL — ABNORMAL HIGH (ref 70–99)
Potassium: 3.9 mmol/L (ref 3.5–5.1)
Sodium: 141 mmol/L (ref 135–145)

## 2023-07-03 LAB — ECHOCARDIOGRAM COMPLETE
Area-P 1/2: 3.33 cm2
Height: 67 in
S' Lateral: 2.3 cm
Weight: 2081.14 [oz_av]

## 2023-07-03 LAB — CBC
HCT: 28 % — ABNORMAL LOW (ref 36.0–46.0)
Hemoglobin: 9.2 g/dL — ABNORMAL LOW (ref 12.0–15.0)
MCH: 31.4 pg (ref 26.0–34.0)
MCHC: 32.9 g/dL (ref 30.0–36.0)
MCV: 95.6 fL (ref 80.0–100.0)
Platelets: 117 10*3/uL — ABNORMAL LOW (ref 150–400)
RBC: 2.93 MIL/uL — ABNORMAL LOW (ref 3.87–5.11)
RDW: 13.1 % (ref 11.5–15.5)
WBC: 8.5 10*3/uL (ref 4.0–10.5)
nRBC: 0 % (ref 0.0–0.2)

## 2023-07-03 MED ORDER — TIMOLOL MALEATE 0.5 % OP SOLN
1.0000 [drp] | Freq: Every day | OPHTHALMIC | Status: DC
  Administered 2023-07-03 – 2023-07-05 (×3): 1 [drp] via OPHTHALMIC
  Filled 2023-07-03: qty 5

## 2023-07-03 MED ORDER — HALOPERIDOL LACTATE 5 MG/ML IJ SOLN: 1.0000 mg | Freq: Once | INTRAMUSCULAR | Status: DC

## 2023-07-03 MED ORDER — ACETAMINOPHEN 500 MG PO TABS
1000.0000 mg | ORAL_TABLET | Freq: Four times a day (QID) | ORAL | Status: DC | PRN
  Administered 2023-07-03: 1000 mg via ORAL
  Filled 2023-07-03: qty 2

## 2023-07-03 MED ORDER — HALOPERIDOL LACTATE 5 MG/ML IJ SOLN
1.0000 mg | Freq: Once | INTRAMUSCULAR | Status: AC
  Administered 2023-07-03: 1 mg via INTRAMUSCULAR
  Filled 2023-07-03: qty 1

## 2023-07-03 NOTE — Progress Notes (Signed)
    Patient Name: Erica Day           DOB: 09/13/1940  MRN: [id number]      Admission Date: 07/01/2023  Attending Provider: Elease Etienne, MD  Primary Diagnosis: Other fracture of left femur, initial encounter for closed fracture [facility])   Level of care: Med-Surg    CROSS COVER NOTE   Date of Service   07/03/2023   [facility], 82 y.o. female, was admitted on 07/01/2023 for Other fracture of left femur, initial encounter for closed fracture (HCC).    HPI/Events of Note   Restless, and confused  Postoperative delirium?  Family endorses patient has been slightly confused postop.  Currently removing medical equipment including IV, noncompliant with care. High fall risk.   Unfortunately, she is not following commands despite multiple attempts at redirection by staff.  Family is also trying to assist in redirecting.   Interventions/ Plan   Will trial Haldol for agitation.         Anthoney Harada, DNP, ACNPC- AG [facility]

## 2023-07-03 NOTE — Plan of Care (Signed)
  Problem: Education: Goal: Knowledge of General Education information will improve Description Including pain rating scale, medication(s)/side effects and non-pharmacologic comfort measures Outcome: Progressing   

## 2023-07-03 NOTE — Evaluation (Signed)
Occupational Therapy Evaluation Patient Details Name: Erica Day MRN: [id number] DOB: 10-21-1940 Today's Date: 07/03/2023   History of Present Illness 82 y.o. female presents to [facility] 07/01/23 after losing her balance and falling at home. Pt with L periprosthetic supracondylar distal femur fx s/p ORIF 11/15. PMHx: arthritis, GERD, HTN, Sjorgren's syndrome, B TKA, neuropathy   Clinical Impression   Pt admitted for above, currently presents with LLE pain impacting her balance, gait, and ability to complete ADLs. Pt needing Mod A for STS assist and Min A to complete pivot transfer with lateral steps at bedside, occasional winces and dips into slight flexion due to pain. Pt currently requires total to CGA for ADLs, her daughter was present and very helpful throughout session. Pt would benefit from continued acute skilled OT services to address deficits and help transition to next level of care. Patient would benefit from post acute skilled rehab facility with <3 hours of therapy and 24/7 support       If plan is discharge home, recommend the following: A little help with walking and/or transfers;A lot of help with bathing/dressing/bathroom;Assistance with cooking/housework;Assist for transportation;Help with stairs or ramp for entrance    Functional Status Assessment  Patient has had a recent decline in their functional status and demonstrates the ability to make significant improvements in function in a reasonable and predictable amount of time.  Equipment Recommendations  BSC/3in1;Tub/shower bench    Recommendations for Other Services       Precautions / Restrictions Precautions Precautions: Fall Restrictions Weight Bearing Restrictions: Yes LLE Weight Bearing: Weight bearing as tolerated      Mobility Bed Mobility Overal bed mobility: Needs Assistance         Sit to supine: Mod assist, HOB elevated, Used rails   General bed mobility comments: Up in recliner on arrival, assist  with BLE mangement returning to bed.    Transfers Overall transfer level: Needs assistance Equipment used: Rolling walker (2 wheels) Transfers: Sit to/from Stand, Bed to chair/wheelchair/BSC Sit to Stand: Mod assist Stand pivot transfers: Min assist         General transfer comment: Mod A to rise, cues for hand placement. Pivoted from recliner to bed with cues to sequence and verbal reminders to breath/exhale.      Balance Overall balance assessment: Needs assistance Sitting-balance support: Feet supported, No upper extremity supported Sitting balance-Leahy Scale: Fair Sitting balance - Comments: sitting EOB   Standing balance support: Bilateral upper extremity supported, During functional activity, Reliant on assistive device for balance Standing balance-Leahy Scale: Poor Standing balance comment: reliant on RW,                           ADL either performed or assessed with clinical judgement   ADL Overall ADL's : Needs assistance/impaired Eating/Feeding: Independent;Sitting   Grooming: Sitting;Contact guard assist   Upper Body Bathing: Sitting;Contact guard assist   Lower Body Bathing: Sitting/lateral leans;Maximal assistance   Upper Body Dressing : Sitting;Minimal assistance   Lower Body Dressing: Sitting/lateral leans;Total assistance Lower Body Dressing Details (indicate cue type and reason): donning socks, tried shoes but L shoe not able to fit over bandage Toilet Transfer: Minimal assistance;Squat-pivot;Rolling walker (2 wheels) Toilet Transfer Details (indicate cue type and reason): simulated with recliner to bed transfer Toileting- Clothing Manipulation and Hygiene: Maximal assistance;Sit to/from stand       Functional mobility during ADLs: Minimal assistance;Rolling walker (2 wheels) (pivots to/from) General ADL Comments: Pt daughter present and  supportive during session     Vision         Perception         Praxis          Pertinent Vitals/Pain Pain Assessment Pain Assessment: Faces Faces Pain Scale: Hurts even more Pain Location: L LE Pain Descriptors / Indicators: Aching, Discomfort, Sore Pain Intervention(s): Monitored during session, Limited activity within patient's tolerance, Repositioned, Patient requesting pain meds-RN notified     Extremity/Trunk Assessment Upper Extremity Assessment Upper Extremity Assessment: Generalized weakness;LUE deficits/detail;RUE deficits/detail RUE Deficits / Details: hx of shoulder replacement, more functional than LUE LUE Deficits / Details: limited shoulder ROM due to hx of injured rotator LUE: Unable to fully assess due to pain   Lower Extremity Assessment Lower Extremity Assessment: Generalized weakness LLE Deficits / Details: limited knee ext/flex and hip flexion due to pain, L LE bandaged   Cervical / Trunk Assessment Cervical / Trunk Assessment: Kyphotic   Communication Communication Communication: No apparent difficulties Cueing Techniques: Verbal cues   Cognition Arousal: Alert Behavior During Therapy: WFL for tasks assessed/performed Overall Cognitive Status: Within Functional Limits for tasks assessed                                       General Comments  VSS on RA, duaghter present and interpreting.    Exercises     Shoulder Instructions      Home Living Family/patient expects to be discharged to:: Private residence Living Arrangements: Children (daughter) Available Help at Discharge: Family;Available 24 hours/day Type of Home: House Home Access: Level entry (garage entry)     Home Layout: Two level (uses chair lift to get from ground floor to 2nd floor)     Bathroom Shower/Tub: Chief Strategy Officer: Standard Bathroom Accessibility: Yes   Home Equipment: Grab bars - tub/shower;Grab bars - toilet;Rolling Walker (2 wheels);Rollator (4 wheels);Cane - single point;Transport chair          Prior  Functioning/Environment Prior Level of Function : Independent/Modified Independent;History of Falls (last six months)             Mobility Comments: uses RW, reports only this fall in the past 6 months after turning quickly. However, reports 8 falls in years prior ADLs Comments: daughter helps with bra and sweaters, daugher cooks, cleans, drives        OT Problem List: Pain;Impaired balance (sitting and/or standing);Decreased strength      OT Treatment/Interventions: Self-care/ADL training;Therapeutic exercise;Balance training;Therapeutic activities;Patient/family education    OT Goals(Current goals can be found in the care plan section) Acute Rehab OT Goals Patient Stated Goal: To start moving around better OT Goal Formulation: With patient Time For Goal Achievement: 07/17/23 Potential to Achieve Goals: Good ADL Goals Pt Will Perform Grooming: standing;with contact guard assist Pt Will Perform Lower Body Bathing: with set-up;sitting/lateral leans Pt Will Perform Lower Body Dressing: with min assist;sitting/lateral leans Pt Will Transfer to Toilet: stand pivot transfer;with contact guard assist;bedside commode  OT Frequency: Min 1X/week    Co-evaluation              AM-PAC OT "6 Clicks" Daily Activity     Outcome Measure Help from another person eating meals?: None Help from another person taking care of personal grooming?: A Little Help from another person toileting, which includes using toliet, bedpan, or urinal?: A Lot Help from another person bathing (including washing, rinsing, drying)?:  A Lot Help from another person to put on and taking off regular upper body clothing?: A Little Help from another person to put on and taking off regular lower body clothing?: Total 6 Click Score: 15   End of Session Equipment Utilized During Treatment: Gait belt;Rolling walker (2 wheels) Nurse Communication: Mobility status  Activity Tolerance: Patient tolerated treatment  well Patient left: in bed;with call bell/phone within reach;with bed alarm set;with family/visitor present  OT Visit Diagnosis: Unsteadiness on feet (R26.81);Other abnormalities of gait and mobility (R26.89);Muscle weakness (generalized) (M62.81);History of falling (Z91.81);Pain Pain - Right/Left: Left Pain - part of body: Leg                Time: 4098-1191 OT Time Calculation (min): 27 min Charges:  OT General Charges $OT Visit: 1 Visit OT Evaluation $OT Eval Moderate Complexity: 1 Mod OT Treatments $Therapeutic Activity: 8-22 mins  07/03/2023  AB, OTR/L  Acute Rehabilitation Services  Office: [phone]   Tristan Schroeder 07/03/2023, 6:07 PM

## 2023-07-03 NOTE — Progress Notes (Signed)
Echocardiogram 2D Echocardiogram has been performed.  Warren Lacy Shyonna Carlin RDCS 07/03/2023, 11:10 AM

## 2023-07-03 NOTE — Progress Notes (Signed)
PROGRESS NOTE   Erica Day  QVZ:[id number]    DOB: Dec 01, 1940    DOA: 07/01/2023  PCP: [facility]   I have briefly reviewed patients previous medical records in [facility].  Chief Complaint  Patient presents with   [facility] Course:  82 year old female, with her daughter, ambulates with the help of a cane or a walker, medical history significant for diffuse B-cell lymphoma, Sjogren syndrome, anemia, C. difficile, GERD, HTN, B/L TKR, presented to ED following a fall at home when she was walking with her walker, fell to the left side and noted left leg swelling and pain.  Admitted for left periprosthetic supracondylar distal femur fracture.  Orthopedics consulted and she underwent ORIF on 11/15.  Postop course complicated by agitated delirium.   Assessment & Plan:  Principal Problem:   Other fracture of left femur, initial encounter for closed fracture [facility]) Active Problems:   Sjogren's syndrome (HCC)   Fall   Cardiac murmur   Elevated BP without diagnosis of hypertension   Abnormal CT scan, cervical spine   Left periprosthetic supracondylar distal femur fracture.   Sustained after a ground-level mechanical fall at home Orthopedics consulted and she underwent ORIF on 11/15. As per orthopedics: WBAT on LLE, Lovenox for DVT prophylaxis while hospitalized and oral DOAC at discharge.  2 weeks office follow-up with Dr. [name]. Multimodality pain control.  Minimize opioids PT and OT evaluation.  PT recommends SNF at discharge.  Postop delirium Multifactorial: Advanced age, pain, pain meds, hospital delirium etc. Bladder scan: 250 mL Delirium precautions.  Minimize opioids.  Discussed with daughter at bedside and she will try and stay with patient which will definitely help.  Acute blood loss anemia Hemoglobin dropped from 13.5-11.5-9.2. EBL 100 mL Likely due to fracture and surgery. Follow CBC daily and transfuse if hemoglobin 7 g  or less.  Thrombocytopenia Appears to be new. Possibly from acute blood loss.  Better compared to yesterday, 106 > 117 Follow CBC in AM.  Stage IIIb CKD Creatinine in Care Everywhere on 9/25: 1.24 and on 6/12: 1.32 Interestingly creatinine here was 0.93 on 11/14 and up to 1.27.  Creatinine down to 1.07. Follow BMP.  Avoid nephrotoxics.  Mechanical fall CT head and C-spine without acute abnormalities. Other injuries as noted above. PT and OT evaluation, SNF recommended.  Essential hypertension Controlled on metoprolol 50 Mg twice daily Admitting MD had obtained 2D echo due to murmur heard on exam.  Preserved LVEF, indeterminate diastolic function, no significant valvular issues.  Hyperlipidemia Continue Zocor  Sjogren's syndrome Stable.  Resume immunosuppressive's  Abnormal CT scan, cervical spine Radiology noted possible lesion could be secondary to metastatic disease.  Patient has known history of lymphoma although there will be less likely she has been actively followed by oncology.  Admitting MD discussed at length with family and recommended follow-up closely with oncology.  Patient may need repeat PET scan  Body mass index is 20.37 kg/m.  Nutritional Status Nutrition Problem: Inadequate oral intake Etiology: decreased appetite Signs/Symptoms: per patient/family report Interventions: Ensure Enlive (each supplement provides 350kcal and 20 grams of protein)   DVT prophylaxis: enoxaparin (LOVENOX) injection 40 mg Start: 07/03/23 0800 SCDs Start: 07/02/23 1331 SCDs Start: 07/01/23 2300     Code Status: Full Code:  Family Communication: Daughter are at bedside. Disposition:  Status is: Inpatient Remains inpatient appropriate because: Immediate postop.     Consultants:   Orthopedics  Procedures:   As noted above  Antimicrobials:     Subjective:  Overnight events noted.  Confused, somewhat agitated, pulled out IV, given Haldol 1 mg overnight.  This morning,  daughter at bedside, patient paranoid, stating that somebody stole her jewelry.  Confused.  Not agitated.  Mild pain  Objective:   Vitals:   07/02/23 2052 07/03/23 0039 07/03/23 0440 07/03/23 0758  BP: (!) 147/55 135/69 126/61 (!) 152/68  Pulse: 94 92 83 94  Resp: 18 18 20 15   Temp: 98 F (36.7 C) 98.1 F (36.7 C) 99.8 F (37.7 C) (!) 97.5 F (36.4 C)  TempSrc: Oral Oral Oral Oral  SpO2: 96% 98% 92% 93%  Weight:      Height:        General exam: Elderly female, small built and moderately nourished lying comfortably propped up in bed without distress.  Did not appear in any distress. Respiratory system: Clear to auscultation.  No increased work of breathing. Cardiovascular system: S1 & S2 heard, RRR. No JVD, rubs, gallops or clicks. No pedal edema.?  Soft systolic murmur at apex.  Off of telemetry. Gastrointestinal system: Abdomen is nondistended, soft and nontender. No organomegaly or masses felt. Normal bowel sounds heard. Central nervous system: Alert and oriented only to self. No focal neurological deficits. Extremities: Symmetric 5 x 5 power.  Left leg post op site dressing clean, dry and intact Skin: No rashes, lesions or ulcers Psychiatry: Judgement and insight impaired. Mood & affect pleasantly confused.     Data Reviewed:   I have personally reviewed following labs and imaging studies   CBC: Recent Labs  Lab 07/02/23 0050 07/02/23 1453 07/03/23 0532  WBC 7.7 7.7 8.5  NEUTROABS 6.7  --   --   HGB 13.5 11.5* 9.2*  HCT 40.3 35.8* 28.0*  MCV 95.5 100.8* 95.6  PLT 139* 106* 117*    Basic Metabolic Panel: Recent Labs  Lab 07/01/23 2038 07/01/23 2212 07/02/23 1453 07/03/23 0532  NA 139  --   --  141  K 4.1  --   --  3.9  CL 107  --   --  105  CO2 22  --   --  26  GLUCOSE 166*  --   --  136*  BUN 33*  --   --  28*  CREATININE 0.93  --  1.27* 1.07*  CALCIUM 9.0  --   --  8.8*  MG  --  1.8  --   --   PHOS  --  4.2  --   --     Liver Function  Tests: Recent Labs  Lab 07/01/23 2212  AST 41  ALT 34  ALKPHOS 25*  BILITOT 0.9  PROT 5.8*  ALBUMIN 3.5    CBG: No results for input(s): "GLUCAP" in the last 168 hours.  Microbiology Studies:   Recent Results (from the past 240 hour(s))  Surgical PCR screen     Status: Abnormal   Collection Time: 07/02/23 12:18 AM   Specimen: Nasal Mucosa; Nasal Swab  Result Value Ref Range Status   MRSA, PCR NEGATIVE NEGATIVE Final   Staphylococcus aureus POSITIVE (A) NEGATIVE Final    Comment: (NOTE) The Xpert SA Assay (FDA approved for NASAL specimens in patients 35 years of age and older), is one component of a comprehensive surveillance program. It is not intended to diagnose infection nor to guide or monitor treatment. Performed at [facility], [address], [place], [place] [address]     Radiology Studies:  ECHOCARDIOGRAM  COMPLETE  Result Date: 07/03/2023    ECHOCARDIOGRAM REPORT   Patient Name:   AMALIAH STEINBECK Date of Exam: 07/03/2023 Medical Rec #:  [id number]     Height:       67.0 in Accession #:    [id number]    Weight:       130.1 lb Date of Birth:  07-27-1941      BSA:          1.684 m Patient Age:    82 years      BP:           126/61 mmHg Patient Gender: F             HR:           70 bpm. Exam Location:  Inpatient Procedure: 2D Echo, Color Doppler and Cardiac Doppler Indications:    R01.1 Murmur  History:        Patient has prior history of Echocardiogram examinations, most                 recent 07/01/2020. Risk Factors:Hypertension.  Sonographer:    Irving Burton Senior RDCS Referring Phys: [id number] ANASTASSIA DOUTOVA  Sonographer Comments: Some windows limited by thin body habitus, femur fracture IMPRESSIONS  1. Left ventricular ejection fraction, by estimation, is 60 to 65%. The left ventricle has normal function. The left ventricle has no regional wall motion abnormalities. Left ventricular diastolic parameters are indeterminate.  2. Right ventricular systolic function is  normal. The right ventricular size is normal. Tricuspid regurgitation signal is inadequate for assessing PA pressure.  3. Left atrial size was mild to moderately dilated.  4. The mitral valve is degenerative. Trivial mitral valve regurgitation. No evidence of mitral stenosis. Severe mitral annular calcification.  5. The aortic valve is tricuspid. There is mild calcification of the aortic valve. Aortic valve regurgitation is trivial. Aortic valve sclerosis is present, with no evidence of aortic valve stenosis.  6. The inferior vena cava is normal in size with <50% respiratory variability, suggesting right atrial pressure of 8 mmHg. FINDINGS  Left Ventricle: Left ventricular ejection fraction, by estimation, is 60 to 65%. The left ventricle has normal function. The left ventricle has no regional wall motion abnormalities. The left ventricular internal cavity size was normal in size. There is  no left ventricular hypertrophy. Left ventricular diastolic function could not be evaluated due to mitral annular calcification (moderate or greater). Left ventricular diastolic parameters are indeterminate. Right Ventricle: The right ventricular size is normal. No increase in right ventricular wall thickness. Right ventricular systolic function is normal. Tricuspid regurgitation signal is inadequate for assessing PA pressure. Left Atrium: Left atrial size was mild to moderately dilated. Right Atrium: Right atrial size was normal in size. Pericardium: There is no evidence of pericardial effusion. Mitral Valve: The mitral valve is degenerative in appearance. Severe mitral annular calcification. Trivial mitral valve regurgitation. No evidence of mitral valve stenosis. The mean mitral valve gradient is 2.4 mmHg. Tricuspid Valve: The tricuspid valve is normal in structure. Tricuspid valve regurgitation is trivial. Aortic Valve: The aortic valve is tricuspid. There is mild calcification of the aortic valve. Aortic valve regurgitation  is trivial. Aortic valve sclerosis is present, with no evidence of aortic valve stenosis. Pulmonic Valve: The pulmonic valve was grossly normal. Pulmonic valve regurgitation is mild. Aorta: The aortic root is normal in size and structure and the ascending aorta was not well visualized. Venous: The inferior vena cava is normal in size with less than  50% respiratory variability, suggesting right atrial pressure of 8 mmHg. IAS/Shunts: The atrial septum is grossly normal.  LEFT VENTRICLE PLAX 2D LVIDd:         3.50 cm   Diastology LVIDs:         2.30 cm   LV e' medial:    4.68 cm/s LV PW:         0.90 cm   LV E/e' medial:  20.8 LV IVS:        0.90 cm   LV e' lateral:   4.57 cm/s LVOT diam:     1.80 cm   LV E/e' lateral: 21.3 LV SV:         47 LV SV Index:   28 LVOT Area:     2.54 cm  RIGHT VENTRICLE RV S prime:     10.80 cm/s TAPSE (M-mode): 1.9 cm LEFT ATRIUM             Index        RIGHT ATRIUM           Index LA diam:        3.80 cm 2.26 cm/m   RA Area:     12.90 cm LA Vol (A2C):   31.9 ml 18.94 ml/m  RA Volume:   32.70 ml  19.41 ml/m LA Vol (A4C):   49.3 ml 29.27 ml/m LA Biplane Vol: 39.2 ml 23.27 ml/m  AORTIC VALVE LVOT Vmax:   87.00 cm/s LVOT Vmean:  67.700 cm/s LVOT VTI:    0.183 m  AORTA Ao Root diam: 3.10 cm MITRAL VALVE MV Area (PHT): 3.33 cm     SHUNTS MV Mean grad:  2.4 mmHg     Systemic VTI:  0.18 m MV Decel Time: 228 msec     Systemic Diam: 1.80 cm MV E velocity: 97.50 cm/s MV A velocity: 122.00 cm/s MV E/A ratio:  0.80 Weston Brass MD Electronically signed by Weston Brass MD Signature Date/Time: 07/03/2023/1:20:15 PM    Final    DG FEMUR PORT MIN 2 VIEWS LEFT  Result Date: 07/02/2023 CLINICAL DATA:  Postop. EXAM: LEFT FEMUR PORTABLE 2 VIEWS COMPARISON:  Preoperative imaging. FINDINGS: Lateral plate and multi screw fixation of comminuted distal femur fracture. Improved fracture alignment from preoperative imaging. Left knee arthroplasty is again seen. Recent postsurgical change includes  air and edema in the soft tissues. There are prominent vascular calcifications. Fluffy calcifications projecting over the knee joint space are unchanged from preoperative exam. IMPRESSION: ORIF of comminuted distal femur fracture. Electronically Signed   By: Narda Rutherford M.D.   On: 07/02/2023 13:42   DG FEMUR MIN 2 VIEWS LEFT  Result Date: 07/02/2023 CLINICAL DATA:  Elective surgery. EXAM: LEFT FEMUR 2 VIEWS COMPARISON:  Preoperative imaging FINDINGS: Seven fluoroscopic spot views of the left femur obtained in the operating room. Lateral plate and screw fixation of distal femur fracture. Previous knee arthroplasty. Fluoroscopy time 60.3 seconds. Dose 1.03 mGy. IMPRESSION: Intraoperative fluoroscopy during distal femur fracture fixation. Electronically Signed   By: Narda Rutherford M.D.   On: 07/02/2023 13:40   DG C-Arm 1-60 Min-No Report  Result Date: 07/02/2023 Fluoroscopy was utilized by the requesting physician.  No radiographic interpretation.   DG CHEST PORT 1 VIEW  Result Date: 07/01/2023 CLINICAL DATA:  [id number] Preop cardiovascular exam [id number] EXAM: PORTABLE CHEST 1 VIEW COMPARISON:  Chest x-ray 02/07/2018 FINDINGS: Reversed right total shoulder arthroplasty. The heart and mediastinal contours are within normal limits. Atherosclerotic plaque Right base atelectasis. Increased  interstitial markings, peribronchial cuffing, Kerley B lines. No pleural effusion. No pneumothorax. No acute osseous abnormality. IMPRESSION: 1. Pulmonary edema. 2.  Aortic Atherosclerosis (ICD10-I70.0). Electronically Signed   By: Tish Frederickson M.D.   On: 07/01/2023 23:22   CT Head Wo Contrast  Result Date: 07/01/2023 CLINICAL DATA:  Head trauma, minor (Age >= 65y); Neck trauma (Age >= 65y) EXAM: CT HEAD WITHOUT CONTRAST CT CERVICAL SPINE WITHOUT CONTRAST TECHNIQUE: Multidetector CT imaging of the head and cervical spine was performed following the standard protocol without intravenous contrast. Multiplanar CT  image reconstructions of the cervical spine were also generated. RADIATION DOSE REDUCTION: This exam was performed according to the departmental dose-optimization program which includes automated exposure control, adjustment of the mA and/or kV according to patient size and/or use of iterative reconstruction technique. COMPARISON:  None Available. FINDINGS: CT HEAD FINDINGS Brain: No hemorrhage. No hydrocephalus. No extra-axial fluid collection. No CT evidence of an acute cortical infarct. No mass effect. No mass lesion. There is sequela of moderate overall chronic microvascular ischemic change. 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 Skull: Normal. Negative for fracture or focal lesion. Sinuses/Orbits: No middle ear or mastoid effusion. Paranasal sinuses are clear. Bilateral lens replacement. Orbits are otherwise unremarkable. Other: None. CT CERVICAL SPINE FINDINGS Alignment: Grade 1 anterolisthesis of C5 on C6. Skull base and vertebrae: No acute fracture. There is a nonspecific hyperdense lesion in the C5 vertebral body level. Recommend comparison with prior imaging to exclude the possibility of osseous metastatic disease Soft tissues and spinal canal: No prevertebral fluid or swelling. No visible canal hematoma. Disc levels:  No evidence of high-grade spinal canal stenosis. Upper chest: Negative. Other: None IMPRESSION: 1. No acute intracranial abnormality. 2. No acute fracture or traumatic subluxation of the cervical spine. 3. Nonspecific hyperdense lesion in the C5 vertebral body level. Recommend comparison with prior imaging to exclude the possibility of osseous metastatic disease. Electronically Signed   By: Lorenza Cambridge M.D.   On: 07/01/2023 19:36   CT Cervical Spine Wo Contrast  Result Date: 07/01/2023 CLINICAL DATA:  Head trauma, minor (Age >= 65y); Neck trauma (Age >= 65y) EXAM: CT HEAD WITHOUT CONTRAST CT CERVICAL  SPINE WITHOUT CONTRAST TECHNIQUE: Multidetector CT imaging of the head and cervical spine was performed following the standard protocol without intravenous contrast. Multiplanar CT image reconstructions of the cervical spine were also generated. RADIATION DOSE REDUCTION: This exam was performed according to the departmental dose-optimization program which includes automated exposure control, adjustment of the mA and/or kV according to patient size and/or use of iterative reconstruction technique. COMPARISON:  None Available. FINDINGS: CT HEAD FINDINGS Brain: No hemorrhage. No hydrocephalus. No extra-axial fluid collection. No CT evidence of an acute cortical infarct. No mass effect. No mass lesion. There is sequela of moderate overall chronic microvascular ischemic change. 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 Skull: Normal. Negative for fracture or focal lesion. Sinuses/Orbits: No middle ear or mastoid effusion. Paranasal sinuses are clear. Bilateral lens replacement. Orbits are otherwise unremarkable. Other: None. CT CERVICAL SPINE FINDINGS Alignment: Grade 1 anterolisthesis of C5 on C6. Skull base and vertebrae: No acute fracture. There is a nonspecific hyperdense lesion in the C5 vertebral body level. Recommend comparison with prior imaging to exclude the possibility of osseous metastatic disease Soft tissues and spinal canal: No prevertebral fluid or swelling. No visible canal hematoma. Disc levels:  No evidence of high-grade  spinal canal stenosis. Upper chest: Negative. Other: None IMPRESSION: 1. No acute intracranial abnormality. 2. No acute fracture or traumatic subluxation of the cervical spine. 3. Nonspecific hyperdense lesion in the C5 vertebral body level. Recommend comparison with prior imaging to exclude the possibility of osseous metastatic disease. Electronically Signed   By: Lorenza Cambridge M.D.   On: 07/01/2023 19:36    DG Tibia/Fibula Left  Result Date: 07/01/2023 CLINICAL DATA:  Pain after fall. EXAM: LEFT TIBIA AND FIBULA - 2 VIEW COMPARISON:  None Available. FINDINGS: No evidence of lower leg fracture. Knee arthroplasty is intact were visualized. Distal femur fracture is partially included in the field of view. Ankle alignment is intact, distal most tibia and fibula not included in the AP view. Prominent vascular calcifications. IMPRESSION: No lower leg fracture. Electronically Signed   By: Narda Rutherford M.D.   On: 07/01/2023 19:33   DG Femur Min 2 Views Left  Result Date: 07/01/2023 CLINICAL DATA:  Left femoral deformity after fall, pain distally. EXAM: LEFT FEMUR 2 VIEWS COMPARISON:  None Available. FINDINGS: Comminuted, angulated and displaced distal femur fracture. No convincing fracture extension to the knee arthroplasty. Femoral shaft and proximal femur are intact. Advanced vascular calcifications. IMPRESSION: Comminuted, angulated and displaced distal femur fracture. No convincing involvement of the knee arthroplasty Electronically Signed   By: Narda Rutherford M.D.   On: 07/01/2023 19:32   DG Pelvis 1-2 Views  Result Date: 07/01/2023 CLINICAL DATA:  Pain after fall. EXAM: PELVIS - 1-2 VIEW COMPARISON:  None Available. FINDINGS: The bones are subjectively under mineralized. The cortical margins of the bony pelvis are intact. No fracture. Pubic symphysis and sacroiliac joints are congruent. Both femoral heads are well-seated in the respective acetabula. No hip dislocation. Mild bilateral hip osteoarthritis. Prominent vascular calcifications. IMPRESSION: 1. No pelvic fracture. 2. Bilateral hip osteoarthritis. Electronically Signed   By: Narda Rutherford M.D.   On: 07/01/2023 19:31    Scheduled Meds:    docusate sodium  100 mg Oral BID   enoxaparin (LOVENOX) injection  40 mg Subcutaneous Q24H   feeding supplement  237 mL Oral BID BM   gabapentin  400 mg Oral QHS   metoprolol tartrate  50 mg Oral  BID   mupirocin ointment  1 Application Nasal BID   simvastatin  20 mg Oral QHS    Continuous Infusions:     ceFAZolin (ANCEF) IV 2 g (07/03/23 0510)     LOS: 2 days     Marcellus Scott, MD,  FACP, [facility], [facility], [facility]   [facility] [place]      To contact the attending provider between 7A-7P or the covering provider during after hours 7P-7A, please log into the web site [email or web address] and access using universal [place] password for that web site. If you do not have the password, please call the hospital operator.  07/03/2023, 2:00 PM

## 2023-07-03 NOTE — Progress Notes (Signed)
     1 Day Post-Op Procedure(s) (LRB): OPEN REDUCTION INTERNAL FIXATION (ORIF) DISTAL FEMUR FRACTURE (Left) Subjective:  Patient reports pain as mild.  Slept okay.  Had accidentally removed IV access, [place] team at bedside just established new IV.  Met with PT yesterday, was able to sit up.  Appears motivated for PT today.  Denies chest pain, ShOB, N/V.  Denies numbness or tingling.  FM at bedside.  No other complaints at this time.  Objective:   VITALS:   Vitals:   07/02/23 2052 07/03/23 0039 07/03/23 0440 07/03/23 0758  BP: (!) 147/55 135/69 126/61 (!) 152/68  Pulse: 94 92 83 94  Resp: 18 18 20 15   Temp: 98 F (36.7 C) 98.1 F (36.7 C) 99.8 F (37.7 C) (!) 97.5 F (36.4 C)  TempSrc: Oral Oral Oral Oral  SpO2: 96% 98% 92% 93%  Weight:      Height:        AAOx4 sitting comfortably Neurologically intact Sensation intact distally Intact pulses distally Dorsiflexion/Plantar flexion intact Incision: dressing C/D/I Compartment soft Wiggles toes appropriately   Lab Results  Component Value Date   WBC 8.5 07/03/2023   HGB 9.2 (L) 07/03/2023   HCT 28.0 (L) 07/03/2023   MCV 95.6 07/03/2023   PLT 117 (L) 07/03/2023   BMET    Component Value Date/Time   NA 141 07/03/2023 0532   K 3.9 07/03/2023 0532   CL 105 07/03/2023 0532   CO2 26 07/03/2023 0532   GLUCOSE 136 (H) 07/03/2023 0532   BUN 28 (H) 07/03/2023 0532   CREATININE 1.07 (H) 07/03/2023 0532   CALCIUM 8.8 (L) 07/03/2023 0532   GFRNONAA 51 (L) 07/03/2023 0532     Xray: stable post-operative imaging  Assessment/Plan: 1 Day Post-Op   Principal Problem:   Other fracture of left femur, initial encounter for closed fracture (HCC) Active Problems:   Sjogren's syndrome (HCC)   Fall   Cardiac murmur   Elevated BP without diagnosis of hypertension   Abnormal CT scan, cervical spine   Procedure(s) (LRB): OPEN REDUCTION INTERNAL FIXATION (ORIF) DISTAL FEMUR FRACTURE (Left)   Narrative: 07/03/23: Hgb 9.2,  mild, stable.  Doing okay POD1.  Motivated to work with PT today.  Possible SNF upon DC, pending PT recommendations.     Plan:  Weightbearing: LLE WBAT ROM/Precautions:  No precautions Mobility: Continue with PT/OT Insicional and dressing care: Daily PRN Pain management: multimodal  Antibiotics: Periop abx Diet: Advance as tolerated VTE prophylaxis: Lovenox and SCDs in house, oral DOAC upon DC Dispo: Possible SNF Follow - up plan: 2 weeks in office with Dr. Catha Gosselin Creed Copper 07/03/2023, 11:50 AM   Contact information:   [id number] 7am-5pm epic message Dr. Blanchie Dessert, or call office for patient follow up: [phone] After hours and holidays please check [email or web address] for group call information for [facility]

## 2023-07-03 NOTE — Plan of Care (Signed)
  Problem: Health Behavior/Discharge Planning: Goal: Ability to manage health-related needs will improve Outcome: Progressing   Problem: Activity: Goal: Risk for activity intolerance will decrease Outcome: Progressing   Problem: Coping: Goal: Level of anxiety will decrease Outcome: Progressing   Problem: Pain Management: Goal: General experience of comfort will improve Outcome: Progressing

## 2023-07-04 DIAGNOSIS — D696 Thrombocytopenia, unspecified: Secondary | ICD-10-CM | POA: Diagnosis not present

## 2023-07-04 DIAGNOSIS — D62 Acute posthemorrhagic anemia: Secondary | ICD-10-CM | POA: Diagnosis not present

## 2023-07-04 DIAGNOSIS — M978XXA Periprosthetic fracture around other internal prosthetic joint, initial encounter: Secondary | ICD-10-CM | POA: Diagnosis not present

## 2023-07-04 DIAGNOSIS — W19XXXD Unspecified fall, subsequent encounter: Secondary | ICD-10-CM | POA: Diagnosis not present

## 2023-07-04 LAB — CBC
HCT: 29.4 % — ABNORMAL LOW (ref 36.0–46.0)
Hemoglobin: 9.8 g/dL — ABNORMAL LOW (ref 12.0–15.0)
MCH: 32.6 pg (ref 26.0–34.0)
MCHC: 33.3 g/dL (ref 30.0–36.0)
MCV: 97.7 fL (ref 80.0–100.0)
Platelets: 110 10*3/uL — ABNORMAL LOW (ref 150–400)
RBC: 3.01 MIL/uL — ABNORMAL LOW (ref 3.87–5.11)
RDW: 13.5 % (ref 11.5–15.5)
WBC: 6.2 10*3/uL (ref 4.0–10.5)
nRBC: 0 % (ref 0.0–0.2)

## 2023-07-04 LAB — BASIC METABOLIC PANEL
Anion gap: 8 (ref 5–15)
BUN: 25 mg/dL — ABNORMAL HIGH (ref 8–23)
CO2: 27 mmol/L (ref 22–32)
Calcium: 8.9 mg/dL (ref 8.9–10.3)
Chloride: 109 mmol/L (ref 98–111)
Creatinine, Ser: 0.98 mg/dL (ref 0.44–1.00)
GFR, Estimated: 57 mL/min — ABNORMAL LOW (ref >60)
Glucose, Bld: 95 mg/dL (ref 70–99)
Potassium: 3.8 mmol/L (ref 3.5–5.1)
Sodium: 144 mmol/L (ref 135–145)

## 2023-07-04 LAB — GLUCOSE, CAPILLARY: Glucose-Capillary: 129 mg/dL — ABNORMAL HIGH (ref 70–99)

## 2023-07-04 NOTE — Plan of Care (Signed)
  Problem: Education: Goal: Knowledge of General Education information will improve Description: Including pain rating scale, medication(s)/side effects and non-pharmacologic comfort measures Outcome: Progressing   Problem: Clinical Measurements: Goal: Will remain free from infection Outcome: Progressing   Problem: Activity: Goal: Risk for activity intolerance will decrease Outcome: Progressing   Problem: Nutrition: Goal: Adequate nutrition will be maintained Outcome: Progressing   Problem: Elimination: Goal: Will not experience complications related to urinary retention Outcome: Progressing   Problem: Pain Management: Goal: General experience of comfort will improve Outcome: Progressing   Problem: Safety: Goal: Ability to remain free from injury will improve Outcome: Progressing   Problem: Skin Integrity: Goal: Risk for impaired skin integrity will decrease Outcome: Progressing

## 2023-07-04 NOTE — Progress Notes (Signed)
PROGRESS NOTE   Erica Day  ZOX:[id number]    DOB: 1941-02-08    DOA: 07/01/2023  PCP: [facility]   I have briefly reviewed patients previous medical records in [facility].  Chief Complaint  Patient presents with   [facility] Course:  82 year old female, with her daughter, ambulates with the help of a cane or a walker, medical history significant for diffuse B-cell lymphoma, Sjogren syndrome, anemia, C. difficile, GERD, HTN, B/L TKR, presented to ED following a fall at home when she was walking with her walker, fell to the left side and noted left leg swelling and pain.  Admitted for left periprosthetic supracondylar distal femur fracture.  Orthopedics consulted and she underwent ORIF on 11/15.  Postop course complicated by agitated delirium-improved.   Assessment & Plan:  Principal Problem:   Other fracture of left femur, initial encounter for closed fracture [facility]) Active Problems:   Sjogren's syndrome (HCC)   Fall   Cardiac murmur   Elevated BP without diagnosis of hypertension   Abnormal CT scan, cervical spine   Left periprosthetic supracondylar distal femur fracture.   Sustained after a ground-level mechanical fall at home Orthopedics consulted and she underwent ORIF on 11/15. As per orthopedics: WBAT on LLE, Lovenox for DVT prophylaxis while hospitalized and oral DOAC at discharge.  2 weeks office follow-up with Dr. [name]. Multimodality pain control.  Minimize opioids.  Has only utilized a dose of Tylenol and a dose of tramadol in the last 24 hours. PT and OT evaluation.  PT recommends SNF at discharge.  As per daughter, patient likely to refuse.  Discussed in detail with patient regarding ideal situation of going to SNF for STR prior to returning home safely.  For now, patient seems to be agreeable.  TOC consulted.  Postop delirium Multifactorial: Advanced age, pain, pain meds, hospital delirium etc. No urinary  retention. Delirium precautions.  Minimize opioids.   Improved.  Acute blood loss anemia Hemoglobin dropped from 13.5-11.5-9.2-9.8. EBL 100 mL Likely due to fracture and surgery. Follow CBC daily and transfuse if hemoglobin 7 g or less.  Thrombocytopenia Appears to be new. Possibly from acute blood loss.  Better compared to yesterday, 106 > 117 Follow CBC in AM.  Stage IIIb CKD Creatinine in Care Everywhere on 9/25: 1.24 and on 6/12: 1.32 Interestingly creatinine here was 0.93 on 11/14 and up to 1.27.  Creatinine down to 1.07 >0.98. Avoid nephrotoxics.  Mechanical fall CT head and C-spine without acute abnormalities. Other injuries as noted above. PT and OT evaluation, SNF recommended.  Essential hypertension Controlled on metoprolol 50 Mg twice daily Admitting MD had obtained 2D echo due to murmur heard on exam.  Preserved LVEF, indeterminate diastolic function, no significant valvular issues.  Hyperlipidemia Continue Zocor  Sjogren's syndrome Stable.  Resume immunosuppressive's  Abnormal CT scan, cervical spine Radiology noted possible lesion could be secondary to metastatic disease.  Patient has known history of lymphoma although there will be less likely she has been actively followed by oncology.  Admitting MD discussed at length with family and recommended follow-up closely with oncology.  Patient may need repeat PET scan  Body mass index is 20.37 kg/m.  Nutritional Status Nutrition Problem: Inadequate oral intake Etiology: decreased appetite Signs/Symptoms: per patient/family report Interventions: Ensure Enlive (each supplement provides 350kcal and 20 grams of protein)   DVT prophylaxis: enoxaparin (LOVENOX) injection 40 mg Start: 07/03/23 0800 SCDs Start: 07/02/23 1331 SCDs Start: 07/01/23 2300  Code Status: Full Code:  Family Communication: Daughter are at bedside 11/18. Disposition:  Status is: Inpatient Remains inpatient appropriate because:  Progressing well postop.  Should be medically optimized for DC on 11/18.     Consultants:   Orthopedics  Procedures:   As noted above  Antimicrobials:     Subjective:  Met with patient along with daughter at bedside.  Patient did not report much pain.  Urinating well.  As per daughter, no further paranoia, confusion has significantly improved, may be by about 75%.  Still with some memory issues.  Daughter indicates that patient may refuse SNF because she would want to return home to her dog.  Objective:   Vitals:   07/03/23 1516 07/03/23 1935 07/04/23 0430 07/04/23 0802  BP: 128/69 (!) 141/75 138/77 (!) 138/56  Pulse: 81 90 81 75  Resp: 17  17 16   Temp: 98.2 F (36.8 C) 98.2 F (36.8 C) 98.2 F (36.8 C) 98.4 F (36.9 C)  TempSrc: Oral Oral Oral Oral  SpO2: 97% 94% 95%   Weight:      Height:        General exam: Elderly female, small built and moderately nourished and up comfortably in bed without distress.  Appears to be in good spirits. Respiratory system: Clear to auscultation.  No increased work of breathing. Cardiovascular system: S1 & S2 heard, RRR. No JVD, rubs, gallops or clicks. No pedal edema.?  Soft systolic murmur at apex.  Off of telemetry. Gastrointestinal system: Abdomen is nondistended, soft and nontender. No organomegaly or masses felt. Normal bowel sounds heard. Central nervous system: Alert and oriented x 2.  No focal deficits. Extremities: Symmetric 5 x 5 power.  Left leg post op site dressing clean, dry and intact Skin: No rashes, lesions or ulcers Psychiatry: Judgement and insight impaired but improved compared to yesterday. Mood & affect pleasant and appropriate    Data Reviewed:   I have personally reviewed following labs and imaging studies   CBC: Recent Labs  Lab 07/02/23 0050 07/02/23 1453 07/03/23 0532 07/04/23 0600  WBC 7.7 7.7 8.5 6.2  NEUTROABS 6.7  --   --   --   HGB 13.5 11.5* 9.2* 9.8*  HCT 40.3 35.8* 28.0* 29.4*  MCV 95.5  100.8* 95.6 97.7  PLT 139* 106* 117* 110*    Basic Metabolic Panel: Recent Labs  Lab 07/01/23 2038 07/01/23 2212 07/02/23 1453 07/03/23 0532 07/04/23 0600  NA 139  --   --  141 144  K 4.1  --   --  3.9 3.8  CL 107  --   --  105 109  CO2 22  --   --  26 27  GLUCOSE 166*  --   --  136* 95  BUN 33*  --   --  28* 25*  CREATININE 0.93  --  1.27* 1.07* 0.98  CALCIUM 9.0  --   --  8.8* 8.9  MG  --  1.8  --   --   --   PHOS  --  4.2  --   --   --     Liver Function Tests: Recent Labs  Lab 07/01/23 2212  AST 41  ALT 34  ALKPHOS 25*  BILITOT 0.9  PROT 5.8*  ALBUMIN 3.5    CBG: No results for input(s): "GLUCAP" in the last 168 hours.  Microbiology Studies:   Recent Results (from the past 240 hour(s))  Surgical PCR screen     Status: Abnormal  Collection Time: 07/02/23 12:18 AM   Specimen: Nasal Mucosa; Nasal Swab  Result Value Ref Range Status   MRSA, PCR NEGATIVE NEGATIVE Final   Staphylococcus aureus POSITIVE (A) NEGATIVE Final    Comment: (NOTE) The Xpert SA Assay (FDA approved for NASAL specimens in patients 90 years of age and older), is one component of a comprehensive surveillance program. It is not intended to diagnose infection nor to guide or monitor treatment. Performed at [facility], [address] [place], [place] [address]     Radiology Studies:  ECHOCARDIOGRAM COMPLETE  Result Date: 07/03/2023    ECHOCARDIOGRAM REPORT   Patient Name:   Erica Day Date of Exam: 07/03/2023 Medical Rec #:  [id number]     Height:       67.0 in Accession #:    [id number]    Weight:       130.1 lb Date of Birth:  04-21-1941      BSA:          1.684 m Patient Age:    82 years      BP:           126/61 mmHg Patient Gender: F             HR:           70 bpm. Exam Location:  Inpatient Procedure: 2D Echo, Color Doppler and Cardiac Doppler Indications:    R01.1 Murmur  History:        Patient has prior history of Echocardiogram examinations, most                  recent 07/01/2020. Risk Factors:Hypertension.  Sonographer:    Irving Burton Senior RDCS Referring Phys: [id number] ANASTASSIA DOUTOVA  Sonographer Comments: Some windows limited by thin body habitus, femur fracture IMPRESSIONS  1. Left ventricular ejection fraction, by estimation, is 60 to 65%. The left ventricle has normal function. The left ventricle has no regional wall motion abnormalities. Left ventricular diastolic parameters are indeterminate.  2. Right ventricular systolic function is normal. The right ventricular size is normal. Tricuspid regurgitation signal is inadequate for assessing PA pressure.  3. Left atrial size was mild to moderately dilated.  4. The mitral valve is degenerative. Trivial mitral valve regurgitation. No evidence of mitral stenosis. Severe mitral annular calcification.  5. The aortic valve is tricuspid. There is mild calcification of the aortic valve. Aortic valve regurgitation is trivial. Aortic valve sclerosis is present, with no evidence of aortic valve stenosis.  6. The inferior vena cava is normal in size with <50% respiratory variability, suggesting right atrial pressure of 8 mmHg. FINDINGS  Left Ventricle: Left ventricular ejection fraction, by estimation, is 60 to 65%. The left ventricle has normal function. The left ventricle has no regional wall motion abnormalities. The left ventricular internal cavity size was normal in size. There is  no left ventricular hypertrophy. Left ventricular diastolic function could not be evaluated due to mitral annular calcification (moderate or greater). Left ventricular diastolic parameters are indeterminate. Right Ventricle: The right ventricular size is normal. No increase in right ventricular wall thickness. Right ventricular systolic function is normal. Tricuspid regurgitation signal is inadequate for assessing PA pressure. Left Atrium: Left atrial size was mild to moderately dilated. Right Atrium: Right atrial size was normal in size. Pericardium:  There is no evidence of pericardial effusion. Mitral Valve: The mitral valve is degenerative in appearance. Severe mitral annular calcification. Trivial mitral valve regurgitation. No evidence of mitral valve stenosis.  The mean mitral valve gradient is 2.4 mmHg. Tricuspid Valve: The tricuspid valve is normal in structure. Tricuspid valve regurgitation is trivial. Aortic Valve: The aortic valve is tricuspid. There is mild calcification of the aortic valve. Aortic valve regurgitation is trivial. Aortic valve sclerosis is present, with no evidence of aortic valve stenosis. Pulmonic Valve: The pulmonic valve was grossly normal. Pulmonic valve regurgitation is mild. Aorta: The aortic root is normal in size and structure and the ascending aorta was not well visualized. Venous: The inferior vena cava is normal in size with less than 50% respiratory variability, suggesting right atrial pressure of 8 mmHg. IAS/Shunts: The atrial septum is grossly normal.  LEFT VENTRICLE PLAX 2D LVIDd:         3.50 cm   Diastology LVIDs:         2.30 cm   LV e' medial:    4.68 cm/s LV PW:         0.90 cm   LV E/e' medial:  20.8 LV IVS:        0.90 cm   LV e' lateral:   4.57 cm/s LVOT diam:     1.80 cm   LV E/e' lateral: 21.3 LV SV:         47 LV SV Index:   28 LVOT Area:     2.54 cm  RIGHT VENTRICLE RV S prime:     10.80 cm/s TAPSE (M-mode): 1.9 cm LEFT ATRIUM             Index        RIGHT ATRIUM           Index LA diam:        3.80 cm 2.26 cm/m   RA Area:     12.90 cm LA Vol (A2C):   31.9 ml 18.94 ml/m  RA Volume:   32.70 ml  19.41 ml/m LA Vol (A4C):   49.3 ml 29.27 ml/m LA Biplane Vol: 39.2 ml 23.27 ml/m  AORTIC VALVE LVOT Vmax:   87.00 cm/s LVOT Vmean:  67.700 cm/s LVOT VTI:    0.183 m  AORTA Ao Root diam: 3.10 cm MITRAL VALVE MV Area (PHT): 3.33 cm     SHUNTS MV Mean grad:  2.4 mmHg     Systemic VTI:  0.18 m MV Decel Time: 228 msec     Systemic Diam: 1.80 cm MV E velocity: 97.50 cm/s MV A velocity: 122.00 cm/s MV E/A ratio:  0.80  Weston Brass MD Electronically signed by Weston Brass MD Signature Date/Time: 07/03/2023/1:20:15 PM    Final    DG FEMUR PORT MIN 2 VIEWS LEFT  Result Date: 07/02/2023 CLINICAL DATA:  Postop. EXAM: LEFT FEMUR PORTABLE 2 VIEWS COMPARISON:  Preoperative imaging. FINDINGS: Lateral plate and multi screw fixation of comminuted distal femur fracture. Improved fracture alignment from preoperative imaging. Left knee arthroplasty is again seen. Recent postsurgical change includes air and edema in the soft tissues. There are prominent vascular calcifications. Fluffy calcifications projecting over the knee joint space are unchanged from preoperative exam. IMPRESSION: ORIF of comminuted distal femur fracture. Electronically Signed   By: Narda Rutherford M.D.   On: 07/02/2023 13:42   DG FEMUR MIN 2 VIEWS LEFT  Result Date: 07/02/2023 CLINICAL DATA:  Elective surgery. EXAM: LEFT FEMUR 2 VIEWS COMPARISON:  Preoperative imaging FINDINGS: Seven fluoroscopic spot views of the left femur obtained in the operating room. Lateral plate and screw fixation of distal femur fracture. Previous knee arthroplasty. Fluoroscopy time 60.3 seconds.  Dose 1.03 mGy. IMPRESSION: Intraoperative fluoroscopy during distal femur fracture fixation. Electronically Signed   By: Narda Rutherford M.D.   On: 07/02/2023 13:40   DG C-Arm 1-60 Min-No Report  Result Date: 07/02/2023 Fluoroscopy was utilized by the requesting physician.  No radiographic interpretation.    Scheduled Meds:    docusate sodium  100 mg Oral BID   enoxaparin (LOVENOX) injection  40 mg Subcutaneous Q24H   feeding supplement  237 mL Oral BID BM   gabapentin  400 mg Oral QHS   metoprolol tartrate  50 mg Oral BID   mupirocin ointment  1 Application Nasal BID   simvastatin  20 mg Oral QHS   timolol  1 drop Both Eyes Daily    Continuous Infusions:       LOS: 3 days     Marcellus Scott, MD,  FACP, [facility], [facility], [facility]   [facility]  Physician Advisor [place]      To contact the attending provider between 7A-7P or the covering provider during after hours 7P-7A, please log into the web site [email or web address] and access using universal [place] password for that web site. If you do not have the password, please call the hospital operator.  07/04/2023, 11:33 AM

## 2023-07-04 NOTE — Progress Notes (Signed)
     Subjective:  Patient reports pain as mild.  Slow progress with physical therapy.  Objective:   VITALS:   Vitals:   07/03/23 1516 07/03/23 1935 07/04/23 0430 07/04/23 0802  BP: 128/69 (!) 141/75 138/77 (!) 138/56  Pulse: 81 90 81 75  Resp: 17  17 16   Temp: 98.2 F (36.8 C) 98.2 F (36.8 C) 98.2 F (36.8 C) 98.4 F (36.9 C)  TempSrc: Oral Oral Oral Oral  SpO2: 97% 94% 95%   Weight:      Height:        Neurologically intact Dorsiflexion/Plantar flexion intact Incision: no drainage   Lab Results  Component Value Date   WBC 6.2 07/04/2023   HGB 9.8 (L) 07/04/2023   HCT 29.4 (L) 07/04/2023   MCV 97.7 07/04/2023   PLT 110 (L) 07/04/2023   BMET    Component Value Date/Time   NA 144 07/04/2023 0600   K 3.8 07/04/2023 0600   CL 109 07/04/2023 0600   CO2 27 07/04/2023 0600   GLUCOSE 95 07/04/2023 0600   BUN 25 (H) 07/04/2023 0600   CREATININE 0.98 07/04/2023 0600   CALCIUM 8.9 07/04/2023 0600   GFRNONAA 57 (L) 07/04/2023 0600     Assessment/Plan: 2 Days Post-Op   Principal Problem:   Other fracture of left femur, initial encounter for closed fracture (HCC) Active Problems:   Sjogren's syndrome (HCC)   Fall   Cardiac murmur   Elevated BP without diagnosis of hypertension   Abnormal CT scan, cervical spine   Weightbearing: LLE WBAT ROM/Precautions:  No precautions Mobility: Continue with PT/OT Insicional and dressing care: Daily PRN Pain management: multimodal  Antibiotics: Periop abx Diet: Advance as tolerated VTE prophylaxis: Lovenox and SCDs in house, oral DOAC upon DC Dispo: Possible SNF Follow - up plan: 2 weeks in office with Dr. Angela Burke Robie [place] 07/04/2023, 8:07 AM   Teryl Lucy, MD Cell [phone]

## 2023-07-05 ENCOUNTER — Encounter (HOSPITAL_COMMUNITY): Payer: Self-pay | Admitting: Student

## 2023-07-05 DIAGNOSIS — W19XXXD Unspecified fall, subsequent encounter: Secondary | ICD-10-CM | POA: Diagnosis not present

## 2023-07-05 DIAGNOSIS — M978XXA Periprosthetic fracture around other internal prosthetic joint, initial encounter: Secondary | ICD-10-CM | POA: Diagnosis not present

## 2023-07-05 DIAGNOSIS — D696 Thrombocytopenia, unspecified: Secondary | ICD-10-CM | POA: Diagnosis not present

## 2023-07-05 DIAGNOSIS — D62 Acute posthemorrhagic anemia: Secondary | ICD-10-CM | POA: Diagnosis not present

## 2023-07-05 LAB — CBC
HCT: 29.6 % — ABNORMAL LOW (ref 36.0–46.0)
Hemoglobin: 9.7 g/dL — ABNORMAL LOW (ref 12.0–15.0)
MCH: 31.1 pg (ref 26.0–34.0)
MCHC: 32.8 g/dL (ref 30.0–36.0)
MCV: 94.9 fL (ref 80.0–100.0)
Platelets: 132 10*3/uL — ABNORMAL LOW (ref 150–400)
RBC: 3.12 MIL/uL — ABNORMAL LOW (ref 3.87–5.11)
RDW: 13.3 % (ref 11.5–15.5)
WBC: 8.5 10*3/uL (ref 4.0–10.5)
nRBC: 0 % (ref 0.0–0.2)

## 2023-07-05 MED ORDER — MULTIVITAMINS PO CAPS: 1.0000 | ORAL_CAPSULE | Freq: Every day | ORAL | Status: DC

## 2023-07-05 MED ORDER — TIMOLOL HEMIHYDRATE 0.5 % OP SOLN: 1.0000 [drp] | Freq: Every day | OPHTHALMIC | Status: DC

## 2023-07-05 MED ORDER — MIRABEGRON ER 50 MG PO TB24
50.0000 mg | ORAL_TABLET | Freq: Every day | ORAL | Status: DC
  Administered 2023-07-05: 50 mg via ORAL
  Filled 2023-07-05 (×2): qty 1

## 2023-07-05 MED ORDER — ISOSORBIDE MONONITRATE ER 30 MG PO TB24
30.0000 mg | ORAL_TABLET | Freq: Every day | ORAL | Status: DC
  Administered 2023-07-05 – 2023-07-06 (×2): 30 mg via ORAL
  Filled 2023-07-05 (×2): qty 1

## 2023-07-05 MED ORDER — MAGNESIUM OXIDE 400 MG PO TABS
400.0000 mg | ORAL_TABLET | Freq: Every day | ORAL | Status: DC
  Filled 2023-07-05 (×2): qty 1

## 2023-07-05 MED ORDER — TIMOLOL MALEATE 0.5 % OP SOLN
1.0000 [drp] | Freq: Every day | OPHTHALMIC | Status: DC
  Administered 2023-07-06: 1 [drp] via OPHTHALMIC
  Filled 2023-07-05: qty 5

## 2023-07-05 MED ORDER — TIZANIDINE HCL 2 MG PO TABS: 2.0000 mg | ORAL_TABLET | Freq: Three times a day (TID) | ORAL | 0 refills | Status: AC | PRN

## 2023-07-05 MED ORDER — FOLIC ACID 1 MG PO TABS
1.0000 mg | ORAL_TABLET | Freq: Every day | ORAL | Status: DC
  Administered 2023-07-05 – 2023-07-06 (×2): 1 mg via ORAL
  Filled 2023-07-05 (×2): qty 1

## 2023-07-05 MED ORDER — HYDROXYCHLOROQUINE SULFATE 200 MG PO TABS
200.0000 mg | ORAL_TABLET | Freq: Every day | ORAL | Status: DC
  Administered 2023-07-05 – 2023-07-06 (×2): 200 mg via ORAL
  Filled 2023-07-05 (×2): qty 1

## 2023-07-05 MED ORDER — MAGNESIUM OXIDE -MG SUPPLEMENT 400 (240 MG) MG PO TABS
400.0000 mg | ORAL_TABLET | Freq: Every day | ORAL | Status: DC
  Administered 2023-07-05 – 2023-07-06 (×2): 400 mg via ORAL
  Filled 2023-07-05: qty 1

## 2023-07-05 MED ORDER — LOPERAMIDE HCL 2 MG PO CAPS
2.0000 mg | ORAL_CAPSULE | Freq: Three times a day (TID) | ORAL | Status: DC | PRN
  Administered 2023-07-06 (×2): 2 mg via ORAL
  Filled 2023-07-05 (×2): qty 1

## 2023-07-05 MED ORDER — LATANOPROST 0.005 % OP SOLN
1.0000 [drp] | Freq: Every day | OPHTHALMIC | Status: DC
  Administered 2023-07-06: 1 [drp] via OPHTHALMIC
  Filled 2023-07-05 (×2): qty 2.5

## 2023-07-05 MED ORDER — TEMAZEPAM 7.5 MG PO CAPS
30.0000 mg | ORAL_CAPSULE | Freq: Every day | ORAL | Status: DC
  Administered 2023-07-05: 30 mg via ORAL
  Filled 2023-07-05: qty 4

## 2023-07-05 MED ORDER — APIXABAN 2.5 MG PO TABS: 2.5000 mg | ORAL_TABLET | Freq: Two times a day (BID) | ORAL | 0 refills | Status: DC

## 2023-07-05 MED ORDER — PANTOPRAZOLE SODIUM 40 MG PO TBEC
40.0000 mg | DELAYED_RELEASE_TABLET | Freq: Every day | ORAL | Status: DC
  Administered 2023-07-05 – 2023-07-06 (×2): 40 mg via ORAL
  Filled 2023-07-05 (×2): qty 1

## 2023-07-05 MED ORDER — TRAMADOL HCL 50 MG PO TABS: 50.0000 mg | ORAL_TABLET | Freq: Four times a day (QID) | ORAL | 0 refills | Status: AC | PRN

## 2023-07-05 MED ORDER — FAMOTIDINE 20 MG PO TABS
20.0000 mg | ORAL_TABLET | Freq: Two times a day (BID) | ORAL | Status: DC
  Administered 2023-07-05 – 2023-07-06 (×3): 20 mg via ORAL
  Filled 2023-07-05 (×3): qty 1

## 2023-07-05 MED ORDER — ADULT MULTIVITAMIN W/MINERALS CH
1.0000 | ORAL_TABLET | Freq: Every day | ORAL | Status: DC
  Administered 2023-07-05 – 2023-07-06 (×2): 1 via ORAL
  Filled 2023-07-05 (×2): qty 1

## 2023-07-05 NOTE — TOC Initial Note (Addendum)
Transition of Care [facility]) - Initial/Assessment Note    Patient Details  Name: Erica Day MRN: [id number] Date of Birth: 1940/09/30  Transition of [facility] CM/SW Contact:    Lorri Frederick, LCSW Phone Number: 07/05/2023, 11:22 AM  Clinical Narrative:      Pt reportedly spanish speaking, fluctuating orientation, per epic.  CSW spoke with pt daughter Minus Liberty in room who reports pt does speak some english but "right now is answering in spanish."  Pt from home with [place] and Rosie's husband, no current services.  Discussed PT recommendation for SNF and daughter is agreeable, first choice would be [facility].  Medicare choice document provided.  Permission given to send out referral in hub.      Referral sent out in hub, CSW reached out to [employer] to review.      1330: [facility] does offer bed but cannot receive pt until tomorrow.  Pt daughter informed, MD informed.       Medicare payer with inpt order 07/01/23.   Expected Discharge Plan: Skilled Nursing Facility Barriers to Discharge: Continued Medical Work up, SNF Pending bed offer   Patient Goals and CMS Choice   CMS Medicare.gov Compare Post Acute Care list provided to:: Patient Represenative (must comment) (daughter Minus Liberty)   [place] ownership interest in [facility].provided to:: Adult Children    Expected Discharge Plan and Services In-house Referral: Clinical Social Work   Post Acute Care Choice: Skilled Nursing Facility Living arrangements for the past 2 months: Single Family Home                                      Prior Living Arrangements/Services Living arrangements for the past 2 months: Single Family Home Lives with:: Adult Children (daughter and son in Social worker) Patient language and need for interpreter reviewed:: Yes        Need for Family Participation in Patient Care: Yes (Comment) Care giver support system in place?: Yes (comment) Current home services: Other (comment)  (none) Criminal Activity/Legal Involvement Pertinent to Current Situation/Hospitalization: No - Comment as needed  Activities of Daily Living   ADL Screening (condition at time of admission) Independently performs ADLs?: Yes (appropriate for developmental age) Is the patient deaf or have difficulty hearing?: No Does the patient have difficulty seeing, even when wearing glasses/contacts?: No Does the patient have difficulty concentrating, remembering, or making decisions?: No  Permission Sought/Granted                  Emotional Assessment Appearance:: Appears stated age Attitude/Demeanor/Rapport: Unable to Assess Affect (typically observed): Unable to Assess Orientation: : Oriented to Self, Oriented to Place      Admission diagnosis:  Left leg pain [M79.605] Other fracture of left femur, initial encounter for closed fracture (HCC) [S72.8X2A] Fall, initial encounter [W19.XXXA] Closed comminuted intra-articular fracture of distal end of left femur, initial encounter [facility]) [S72.492A] Patient Active Problem List   Diagnosis Date Noted   Other fracture of left femur, initial encounter for closed fracture (HCC) 07/01/2023   Cardiac murmur 07/01/2023   Elevated BP without diagnosis of hypertension 07/01/2023   Abnormal CT scan, cervical spine 07/01/2023   Fall    UTI due to extended-spectrum beta lactamase (ESBL) producing Escherichia coli 02/07/2018   DLBCL (diffuse large B cell lymphoma) (HCC) 02/07/2018   Sjogren's syndrome (HCC) 02/07/2018   Acute renal failure (ARF) (HCC) 02/07/2018  Severe protein-calorie malnutrition (HCC) 02/07/2018   Leukopenia 02/07/2018   Hyperglycemia 02/07/2018   Anemia 02/07/2018   C. difficile colitis 02/07/2018   PCP:  [facility] Pharmacy:   [id number] - [place], [place] - [address] [place] [address] Phone: [phone] Fax: [phone]     Social Determinants of  Health (SDOH) Social History: SDOH Screenings   Food Insecurity: No Food Insecurity (07/02/2023)  Housing: Low Risk  (07/02/2023)  Transportation Needs: No Transportation Needs (07/02/2023)  Utilities: Not At Risk (07/02/2023)  Social Connections: Unknown (12/30/2021)   Received from [facility]  Tobacco Use: Low Risk  (07/01/2023)   SDOH Interventions:     Readmission Risk Interventions     No data to display

## 2023-07-05 NOTE — [place] FL2 (Signed)
[place] MEDICAID FL2 LEVEL OF CARE FORM     IDENTIFICATION  Patient Name: Erica Day Birthdate: 09/15/1940 Sex: female Admission Date (Current Location): 07/01/2023  [facility] and [place] Number:  Producer, television/film/video and Address:  [facility] [place], [address] [place], [place] [address]      Provider Number: [id number]  Attending Physician Name and Address:  Elease Etienne, MD  Relative Name and Phone Number:  Rodriguez,Rosie Daughter   [phone]    Current Level of Care: Hospital Recommended Level of Care: Skilled Nursing Facility Prior Approval Number:    Date Approved/Denied:   PASRR Number: [id number]  Discharge Plan: SNF    Current Diagnoses: Patient Active Problem List   Diagnosis Date Noted   Other fracture of left femur, initial encounter for closed fracture (HCC) 07/01/2023   Cardiac murmur 07/01/2023   Elevated BP without diagnosis of hypertension 07/01/2023   Abnormal CT scan, cervical spine 07/01/2023   Fall    UTI due to extended-spectrum beta lactamase (ESBL) producing Escherichia coli 02/07/2018   DLBCL (diffuse large B cell lymphoma) (HCC) 02/07/2018   Sjogren's syndrome (HCC) 02/07/2018   Acute renal failure (ARF) (HCC) 02/07/2018   Severe protein-calorie malnutrition (HCC) 02/07/2018   Leukopenia 02/07/2018   Hyperglycemia 02/07/2018   Anemia 02/07/2018   C. difficile colitis 02/07/2018    Orientation RESPIRATION BLADDER Height & Weight     Self, Situation, Place  Normal Incontinent Weight: 130 lb 1.1 oz (59 kg) Height:  5\' 7"  (170.2 cm)  BEHAVIORAL SYMPTOMS/MOOD NEUROLOGICAL BOWEL NUTRITION STATUS      Continent Diet (see discharge summary)  AMBULATORY STATUS COMMUNICATION OF NEEDS Skin   Total Care Verbally Surgical wounds                       Personal Care Assistance Level of Assistance  Bathing, Feeding, Dressing, Total care Bathing Assistance: Maximum assistance Feeding assistance:  Independent Dressing Assistance: Maximum assistance Total Care Assistance: Maximum assistance   Functional Limitations Info  Sight, Hearing, Speech Sight Info: Adequate Hearing Info: Adequate Speech Info: Adequate    SPECIAL CARE FACTORS FREQUENCY  PT (By licensed PT), OT (By licensed OT)     PT Frequency: 5x week OT Frequency: 5x week            Contractures Contractures Info: Not present    Additional Factors Info  Code Status, Allergies Code Status Info: full Allergies Info: Benylin Cough (Diphenhydramine), Codeine, Tape, Aloe Vera, Penicillins, Sertraline, Sulfa Antibiotics           Current Medications (07/05/2023):  This is the current hospital active medication list Current Facility-Administered Medications  Medication Dose Route Frequency Provider Last Rate Last Admin   acetaminophen (TYLENOL) tablet 1,000 mg  1,000 mg Oral Q6H PRN Elease Etienne, MD   1,000 mg at 07/03/23 1744   docusate sodium (COLACE) capsule 100 mg  100 mg Oral BID West Bali, PA-C   100 mg at 07/05/23 0830   enoxaparin (LOVENOX) injection 40 mg  40 mg Subcutaneous Q24H Thyra Breed A, PA-C   40 mg at 07/05/23 0830   famotidine (PEPCID) tablet 20 mg  20 mg Oral BID Hongalgi, Maximino Greenland, MD       feeding supplement (ENSURE ENLIVE / ENSURE PLUS) liquid 237 mL  237 mL Oral BID BM Hongalgi, Anand D, MD   237 mL at 07/04/23 1400   folic acid (FOLVITE) tablet 1 mg  1  mg Oral Daily Hongalgi, Maximino Greenland, MD       gabapentin (NEURONTIN) capsule 400 mg  400 mg Oral QHS West Bali, PA-C   400 mg at 07/04/23 2129   hydroxychloroquine (PLAQUENIL) tablet 200 mg  200 mg Oral Daily Hongalgi, Maximino Greenland, MD       isosorbide mononitrate (IMDUR) 24 hr tablet 30 mg  30 mg Oral Daily Hongalgi, Maximino Greenland, MD       latanoprost (XALATAN) 0.005 % ophthalmic solution 1 drop  1 drop Both Eyes QHS Hongalgi, Anand D, MD       magnesium oxide (MAG-OX) tablet 400 mg  400 mg Oral Daily Hongalgi, Maximino Greenland, MD        methocarbamol (ROBAXIN) tablet 500 mg  500 mg Oral Q6H PRN West Bali, PA-C   500 mg at 07/05/23 9528   Or   methocarbamol (ROBAXIN) injection 500 mg  500 mg Intravenous Q6H PRN West Bali, PA-C       metoprolol tartrate (LOPRESSOR) tablet 50 mg  50 mg Oral BID Thyra Breed A, PA-C   50 mg at 07/05/23 0830   mirabegron ER (MYRBETRIQ) tablet 50 mg  50 mg Oral QHS Hongalgi, Anand D, MD       morphine (PF) 2 MG/ML injection 1-2 mg  1-2 mg Intravenous Q3H PRN West Bali, PA-C   2 mg at 07/02/23 1636   multivitamin capsule 1 capsule  1 capsule Oral Daily Hongalgi, Maximino Greenland, MD       mupirocin ointment (BACTROBAN) 2 % 1 Application  1 Application Nasal BID West Bali, PA-C   1 Application at 07/05/23 0830   ondansetron (ZOFRAN) tablet 4 mg  4 mg Oral Q6H PRN West Bali, PA-C       Or   ondansetron (ZOFRAN) injection 4 mg  4 mg Intravenous Q6H PRN McClung, Sarah A, PA-C       pantoprazole (PROTONIX) EC tablet 40 mg  40 mg Oral Daily Hongalgi, Maximino Greenland, MD       polyethylene glycol (MIRALAX / GLYCOLAX) packet 17 g  17 g Oral Daily PRN West Bali, PA-C       simvastatin (ZOCOR) tablet 20 mg  20 mg Oral QHS McClung, Sarah A, PA-C   20 mg at 07/04/23 2129   temazepam (RESTORIL) capsule 30 mg  30 mg Oral QHS Hongalgi, Anand D, MD       timolol (BETIMOL) 0.5 % ophthalmic solution 1 drop  1 drop Both Eyes Daily Hongalgi, Anand D, MD       traMADol Janean Sark) tablet 50-100 mg  50-100 mg Oral Q6H PRN West Bali, PA-C   50 mg at 07/05/23 4132     Discharge Medications: Please see discharge summary for a list of discharge medications.  Relevant Imaging Results:  Relevant Lab Results:   Additional Information SSN: [id number].  Spanish speaking  Lorri Frederick, LCSW

## 2023-07-05 NOTE — Progress Notes (Signed)
Physical Therapy Treatment  Patient Details Name: Erica Day MRN: [id number] DOB: [birth year] Today's Date: 07/05/2023   History of Present Illness 82 y.o. female presents to [facility] 07/01/23 after losing her balance and falling at home. Pt with L periprosthetic supracondylar distal femur fx s/p ORIF 11/15. PMHx: arthritis, HTN, Sjorgren's syndrome, B TKA, neuropathy    PT Comments  Pt progressing towards physical therapy goals. Session somewhat limited by bowel incontinence, and extended time required for clean up/linen change at start of session. Pt continues to be challenged to clear floor and advance feet in standing, and recommend the [facility] for nursing staff to transfer pt to/from recliner or to/from [facility]. Will continue to follow.     If plan is discharge home, recommend the following: A lot of help with walking and/or transfers;A lot of help with bathing/dressing/bathroom;Assistance with cooking/housework;Assist for transportation   Can travel by private vehicle     No  Equipment Recommendations  None recommended by PT    Recommendations for Other Services       Precautions / Restrictions Precautions Precautions: Fall Restrictions Weight Bearing Restrictions: Yes LLE Weight Bearing: Weight bearing as tolerated     Mobility  Bed Mobility Overal bed mobility: Needs Assistance Bed Mobility: Supine to Sit, Rolling Rolling: Mod assist   Supine to sit: HOB elevated, Used rails, Mod assist Sit to supine: Mod assist, HOB elevated, Used rails   General bed mobility comments: Assist for rolling R and L, advancing LLE towards EOB and elevating trunk to full sitting position. Heavy use of rails throughout.    Transfers Overall transfer level: Needs assistance Equipment used: Rolling walker (2 wheels) Transfers: Sit to/from Stand, Bed to chair/wheelchair/BSC Sit to Stand: Mod assist   Step pivot transfers: Mod assist       General transfer comment: VC's for improved posture,  closer walker proximity and forward gaze. Pt with difficulty advancing feet and required facilitating of weight shift, assist to off weight via gait belt, and guiding hips around to chair.    Ambulation/Gait               General Gait Details: Unable to progress to gait training at this time.   Stairs             Wheelchair Mobility     Tilt Bed    Modified Rankin (Stroke Patients Only)       Balance Overall balance assessment: Needs assistance Sitting-balance support: Feet supported, No upper extremity supported Sitting balance-Leahy Scale: Fair Sitting balance - Comments: sitting EOB   Standing balance support: Bilateral upper extremity supported, During functional activity, Reliant on assistive device for balance Standing balance-Leahy Scale: Poor Standing balance comment: reliant on RW,                            Cognition Arousal: Alert Behavior During Therapy: WFL for tasks assessed/performed Overall Cognitive Status: Within Functional Limits for tasks assessed                                          Exercises General Exercises - Lower Extremity Ankle Circles/Pumps: 10 reps Long Arc Quad: 5 reps Heel Slides: 5 reps    General Comments        Pertinent Vitals/Pain Pain Assessment Pain Assessment: Faces Faces Pain Scale: Hurts even more Pain Location: L LE  Pain Descriptors / Indicators: Aching, Discomfort, Sore Pain Intervention(s): Limited activity within patient's tolerance, Monitored during session, Repositioned    Home Living                          Prior Function            PT Goals (current goals can now be found in the care plan section) Acute Rehab PT Goals Patient Stated Goal: to get better PT Goal Formulation: With patient/family Time For Goal Achievement: 07/16/23 Potential to Achieve Goals: Good Progress towards PT goals: Progressing toward goals    Frequency    Min  1X/week      PT Plan      Co-evaluation              AM-PAC PT "6 Clicks" Mobility   Outcome Measure  Help needed turning from your back to your side while in a flat bed without using bedrails?: A Little Help needed moving from lying on your back to sitting on the side of a flat bed without using bedrails?: A Lot Help needed moving to and from a bed to a chair (including a wheelchair)?: A Lot Help needed standing up from a chair using your arms (e.g., wheelchair or bedside chair)?: A Lot Help needed to walk in hospital room?: Total Help needed climbing 3-5 steps with a railing? : Total 6 Click Score: 11    End of Session Equipment Utilized During Treatment: Gait belt Activity Tolerance: Patient limited by pain Patient left: in bed;with call bell/phone within reach;with bed alarm set Nurse Communication: Mobility status PT Visit Diagnosis: Unsteadiness on feet (R26.81);History of falling (Z91.81);Muscle weakness (generalized) (M62.81)     Time: 1610-9604 PT Time Calculation (min) (ACUTE ONLY): 45 min  Charges:    $Gait Training: 38-52 mins PT General Charges $$ ACUTE PT VISIT: 1 Visit                     Conni Slipper, PT, DPT Acute Rehabilitation Services Secure Chat Preferred Office: [phone]    Marylynn Pearson 07/05/2023, 4:05 PM

## 2023-07-05 NOTE — Progress Notes (Addendum)
PROGRESS NOTE   Erica Day  XBJ:[id number]    DOB: 06-11-1941    DOA: 07/01/2023  PCP: [facility]   I have briefly reviewed patients previous medical [facility] in [facility].  Chief Complaint  Patient presents with   [facility] Course:  82 year old female, with her daughter, ambulates with the help of a cane or a walker, medical history significant for diffuse B-cell lymphoma, Sjogren syndrome, anemia, C. difficile, GERD, HTN, B/L TKR, presented to ED following a fall at home when she was walking with her walker, fell to the left side and noted left leg swelling and pain.  Admitted for left periprosthetic supracondylar distal femur fracture.  Orthopedics consulted and she underwent ORIF on 11/15.  Postop course complicated by agitated delirium-improved.   Assessment & Plan:  Principal Problem:   Other fracture of left femur, initial encounter for closed fracture [facility]) Active Problems:   Sjogren's syndrome (HCC)   Fall   Cardiac murmur   Elevated BP without diagnosis of hypertension   Abnormal CT scan, cervical spine   Left periprosthetic supracondylar distal femur fracture.   Sustained after a ground-level mechanical fall at home Orthopedics consulted and she underwent ORIF on 11/15. As per orthopedics: WBAT on LLE, Lovenox for DVT prophylaxis while hospitalized and Eliquis 2.5 Mg twice daily x 30 days at discharge.  2 weeks office follow-up with Dr. [name]. Multimodality pain control.  Minimize opioids.  Did not receive any pain meds at all all day yesterday.  Got a dose of Robaxin and tramadol this morning. PT and OT evaluation.  PT recommends SNF at discharge.  As per daughter, patient likely to refuse.  Discussed in detail with patient regarding ideal situation of going to SNF for STR prior to returning home safely.  For now, patient seems to be agreeable.  TOC consulted. Reportedly had an episode of urinary retention this morning  requiring In-N-Out catheterization, per daughter's report, confused overnight and even so this morning.  Unsure if this morning's confusion is related to the tramadol she got or is part of her sundowning picture.  Need to continue monitoring over the course of the day.  Postop delirium Multifactorial: Advanced age, pain, pain meds, hospital delirium etc. No urinary retention. Delirium precautions.  Minimize opioids.   Improved but recurred last night  Acute blood loss anemia Hemoglobin dropped from 13.5 to the 9 g range but has been stable. EBL 100 mL Likely due to fracture and surgery.  Thrombocytopenia Appears to be new. Possibly from acute blood loss.  Improving. Follow CBC in AM.  Stage IIIb CKD Creatinine in Care Everywhere on 9/25: 1.24 and on 6/12: 1.32 Interestingly creatinine here was 0.93 on 11/14 and up to 1.27.  Creatinine down to 1.07 >0.98. Avoid nephrotoxics.  Mechanical fall CT head and C-spine without acute abnormalities. Other injuries as noted above. PT and OT evaluation, SNF recommended.  Essential hypertension Controlled on metoprolol 50 Mg twice daily.  Resumed home dose of Imdur 30 mg daily. Admitting MD had obtained 2D echo due to murmur heard on exam.  Preserved LVEF, indeterminate diastolic function, no significant valvular issues.  Hyperlipidemia Continue Zocor  Sjogren's syndrome Stable.  Restarted hydroxychloroquine and folate supplements.  Abnormal CT scan, cervical spine Radiology noted possible lesion could be secondary to metastatic disease.  Patient has known history of lymphoma although there will be less likely she has been actively followed by oncology.  Admitting MD discussed at length  with family and recommended follow-up closely with oncology.  Patient may need repeat PET scan  Insomnia As per daughter's report, patient takes scheduled Restoril 30 mg at bedtime, ordered.  Acute urinary retention: Required In-N-Out catheterization  early this morning as per daughter.  Mobilize.  Minimize opioids.  Restarted home dose of Myrbetriq.  Hopefully will not require further catheterization.  GERD Continue prior home dose of Protonix and also on H2 blockers.  Body mass index is 20.37 kg/m.  Nutritional Status Nutrition Problem: Inadequate oral intake Etiology: decreased appetite Signs/Symptoms: per patient/family report Interventions: Ensure Enlive (each supplement provides 350kcal and 20 grams of protein)   DVT prophylaxis: enoxaparin (LOVENOX) injection 40 mg Start: 07/03/23 0800 SCDs Start: 07/02/23 1331 SCDs Start: 07/01/23 2300     Code Status: Full Code:  Family Communication: Daughter are at bedside 11/18. Disposition:  Status is: Inpatient Remains inpatient appropriate because: Progressing well postop.  Should be medically optimized for DC on 11/18.     Consultants:   Orthopedics  Procedures:   In-N-Out catheterization  Antimicrobials:     Subjective:  Met along with daughter at bedside.  Daughter indicates that patient had a rough night.  She was here with the patient until late yesterday evening and then left to go home.  Until then patient was doing quite well apart from slight confusion.  Eating little.  However overnight, got more confused, texted several of her family members, some paranoia, confused even this morning.  Reportedly had an In-N-Out catheterization.  Objective:   Vitals:   07/04/23 1408 07/04/23 1928 07/05/23 0432 07/05/23 0812  BP: (!) 178/84 (!) 157/68 (!) 150/64 (!) 140/65  Pulse: 88 87 85 97  Resp:  17 16 18   Temp: 98.5 F (36.9 C) 97.8 F (36.6 C) 99.2 F (37.3 C) 97.7 F (36.5 C)  TempSrc: Oral Oral Oral Oral  SpO2: 100% 97% 97% 98%  Weight:      Height:        General exam: Elderly female, small built and moderately nourished and up comfortably in bed without distress.  Appears to be in good spirits. Respiratory system: Clear to auscultation.  No increased work  of breathing. Cardiovascular system: S1 & S2 heard, RRR. No JVD, rubs, gallops or clicks. No pedal edema.?  Soft systolic murmur at apex.  Off of telemetry. Gastrointestinal system: Abdomen is nondistended, soft and nontender. No organomegaly or masses felt. Normal bowel sounds heard. Central nervous system: Alert and oriented x 2.  No focal deficits. Extremities: Symmetric 5 x 5 power.  Left leg post op site dressing clean, dry and intact Skin: No rashes, lesions or ulcers Psychiatry: Judgement and insight impaired and pleasantly confused.  Keep switching between [place] and [place].  Intermittently laughing for no particular reason that I can make out. Mood & affect as noted    Data Reviewed:   I have personally reviewed following labs and imaging studies   CBC: Recent Labs  Lab 07/02/23 0050 07/02/23 1453 07/03/23 0532 07/04/23 0600 07/05/23 0808  WBC 7.7   < > 8.5 6.2 8.5  NEUTROABS 6.7  --   --   --   --   HGB 13.5   < > 9.2* 9.8* 9.7*  HCT 40.3   < > 28.0* 29.4* 29.6*  MCV 95.5   < > 95.6 97.7 94.9  PLT 139*   < > 117* 110* 132*   < > = values in this interval not displayed.    Basic Metabolic  Panel: Recent Labs  Lab 07/01/23 2038 07/01/23 2212 07/02/23 1453 07/03/23 0532 07/04/23 0600  NA 139  --   --  141 144  K 4.1  --   --  3.9 3.8  CL 107  --   --  105 109  CO2 22  --   --  26 27  GLUCOSE 166*  --   --  136* 95  BUN 33*  --   --  28* 25*  CREATININE 0.93  --  1.27* 1.07* 0.98  CALCIUM 9.0  --   --  8.8* 8.9  MG  --  1.8  --   --   --   PHOS  --  4.2  --   --   --     Liver Function Tests: Recent Labs  Lab 07/01/23 2212  AST 41  ALT 34  ALKPHOS 25*  BILITOT 0.9  PROT 5.8*  ALBUMIN 3.5    CBG: Recent Labs  Lab 07/04/23 2111  GLUCAP 129*    Microbiology Studies:   Recent Results (from the past 240 hour(s))  Surgical PCR screen     Status: Abnormal   Collection Time: 07/02/23 12:18 AM   Specimen: Nasal Mucosa; Nasal Swab  Result Value  Ref Range Status   MRSA, PCR NEGATIVE NEGATIVE Final   Staphylococcus aureus POSITIVE (A) NEGATIVE Final    Comment: (NOTE) The Xpert SA Assay (FDA approved for NASAL specimens in patients 43 years of age and older), is one component of a comprehensive surveillance program. It is not intended to diagnose infection nor to guide or monitor treatment. Performed at [facility], [address] [place], [place] [address]     Radiology Studies:  No results found.  Scheduled Meds:    docusate sodium  100 mg Oral BID   enoxaparin (LOVENOX) injection  40 mg Subcutaneous Q24H   famotidine  20 mg Oral BID   feeding supplement  237 mL Oral BID BM   folic acid  1 mg Oral Daily   gabapentin  400 mg Oral QHS   hydroxychloroquine  200 mg Oral Daily   isosorbide mononitrate  30 mg Oral Daily   latanoprost  1 drop Both Eyes QHS   magnesium oxide  400 mg Oral Daily   metoprolol tartrate  50 mg Oral BID   mirabegron ER  50 mg Oral QHS   multivitamin with minerals  1 tablet Oral Daily   mupirocin ointment  1 Application Nasal BID   pantoprazole  40 mg Oral Daily   simvastatin  20 mg Oral QHS   temazepam  30 mg Oral QHS   timolol  1 drop Both Eyes Daily    Continuous Infusions:       LOS: 4 days     Marcellus Scott, MD,  FACP, [facility], [facility], [facility]   [facility] [place]      To contact the attending provider between 7A-7P or the covering provider during after hours 7P-7A, please log into the web site [email or web address] and access using universal [place] password for that web site. If you do not have the password, please call the hospital operator.  07/05/2023, 4:34 PM

## 2023-07-05 NOTE — Progress Notes (Signed)
Orthopaedic Trauma Progress Note  SUBJECTIVE: Doing well this morning.  Reports intermittent, mild pain about operative site.  Denies any numbness or tingling throughout the left lower extremity.  No chest pain. No SOB. No nausea/vomiting. No other complaints.  No family at bedside currently.  Patient remains agreeable to SNF at this point.  OBJECTIVE:  Vitals:   07/05/23 0432 07/05/23 0812  BP: (!) 150/64 (!) 140/65  Pulse: 85 97  Resp: 16 18  Temp: 99.2 F (37.3 C) 97.7 F (36.5 C)  SpO2: 97% 98%    General: Sitting up in bed, no acute distress Respiratory: No increased work of breathing.  Left lower extremity: Dressings removed, incisions are clean, dry, intact.  Some bruising about the knee and distal thigh as expected.  Compartment soft compressible.  No significant calf tenderness.  Ankle DF/PF intact.  Endorses sensation to light touch over all aspects of the foot.  Foot does feel cool but equal to contralateral limb.  2+ DP pulse.  IMAGING: Stable post op imaging.   LABS:  Results for orders placed or performed during the hospital encounter of 07/01/23 (from the past 24 hour(s))  Glucose, capillary     Status: Abnormal   Collection Time: 07/04/23  9:11 PM  Result Value Ref Range   Glucose-Capillary 129 (H) 70 - 99 mg/dL    ASSESSMENT: Erica Day is a 82 y.o. female, 3 Days Post-Op s/p OPEN REDUCTION INTERNAL FIXATION LEFT DISTAL FEMUR FRACTURE  CV/Blood loss: Acute blood loss anemia, Hgb 9.8 on 07/04/2023.  CBC pending this a.m. Hemodynamically stable  PLAN: Weightbearing: WBAT LLE ROM: Unrestricted ROM Incisional and dressing care: Incisions may be left open to air.  Apply Mepilex as needed. Showering: Okay to begin showering and getting incisions wet.  Do not submerge incisions Orthopedic device(s): None  Pain management:  1. Tylenol 1000 mg q 6 hours PRN 2. Robaxin 500 mg q 6 hours PRN 3. Tramadol 50-100 mg q 6 hours PRN 4. Morphine 1-2 mg q 3 hours PRN 5.  Neurontin 400 mg QHS VTE prophylaxis: Lovenox, SCDs ID:  Ancef 2gm post op completed Foley/Lines:  No foley, KVO IVFs Impediments to Fracture Healing: Vitamin D level 49, no additional supplementation needed. Dispo: PT/OT evaluation ongoing, currently recommending SNF.  Patient remains agreeable.  Okay for discharge from ortho standpoint once cleared by medicine team and therapies.  I have signed and placed discharge Rx for pain medication, DVT prophylaxis, muscle relaxer in patient's chart.  D/C recommendations: - Tramadol and muscle relaxer for pain control - Eliquis 2.5 mg BID x 30 days for DVT prophylaxis - No additional need for Vit D supplementation  Follow - up plan: 2 weeks after d/c for wound check and repeat x-rays   Contact information:  Truitt Merle MD, Thyra Breed PA-C. After hours and holidays please check [email or web address] for group call information for [facility]   Thompson Caul, PA-C [phone] (office) [email or web address]

## 2023-07-05 NOTE — Plan of Care (Signed)

## 2023-07-06 DIAGNOSIS — S728X2D Other fracture of left femur, subsequent encounter for closed fracture with routine healing: Secondary | ICD-10-CM | POA: Diagnosis not present

## 2023-07-06 DIAGNOSIS — D696 Thrombocytopenia, unspecified: Secondary | ICD-10-CM | POA: Diagnosis not present

## 2023-07-06 DIAGNOSIS — D62 Acute posthemorrhagic anemia: Secondary | ICD-10-CM | POA: Diagnosis not present

## 2023-07-06 DIAGNOSIS — W19XXXD Unspecified fall, subsequent encounter: Secondary | ICD-10-CM | POA: Diagnosis not present

## 2023-07-06 LAB — TYPE AND SCREEN
ABO/RH(D): A POS
Antibody Screen: POSITIVE
Unit division: 0
Unit division: 0

## 2023-07-06 LAB — BPAM RBC
Blood Product Expiration Date: 202412092359
Blood Product Expiration Date: 202412092359
ISSUE DATE / TIME: 202411140736
Unit Type and Rh: 6200
Unit Type and Rh: 6200

## 2023-07-06 LAB — CBC
HCT: 29.3 % — ABNORMAL LOW (ref 36.0–46.0)
Hemoglobin: 9.6 g/dL — ABNORMAL LOW (ref 12.0–15.0)
MCH: 31.5 pg (ref 26.0–34.0)
MCHC: 32.8 g/dL (ref 30.0–36.0)
MCV: 96.1 fL (ref 80.0–100.0)
Platelets: 144 10*3/uL — ABNORMAL LOW (ref 150–400)
RBC: 3.05 MIL/uL — ABNORMAL LOW (ref 3.87–5.11)
RDW: 13.4 % (ref 11.5–15.5)
WBC: 6.2 10*3/uL (ref 4.0–10.5)
nRBC: 0 % (ref 0.0–0.2)

## 2023-07-06 MED ORDER — SACCHAROMYCES BOULARDII 250 MG PO CAPS
250.0000 mg | ORAL_CAPSULE | Freq: Two times a day (BID) | ORAL | Status: DC
  Administered 2023-07-06: 250 mg via ORAL
  Filled 2023-07-06: qty 1

## 2023-07-06 MED ORDER — SACCHAROMYCES BOULARDII 250 MG PO CAPS: 250.0000 mg | ORAL_CAPSULE | Freq: Two times a day (BID) | ORAL | Status: AC

## 2023-07-06 MED ORDER — LOPERAMIDE HCL 2 MG PO CAPS: 2.0000 mg | ORAL_CAPSULE | Freq: Three times a day (TID) | ORAL | Status: AC | PRN

## 2023-07-06 MED ORDER — TEMAZEPAM 30 MG PO CAPS: 30.0000 mg | ORAL_CAPSULE | Freq: Every day | ORAL | Status: AC

## 2023-07-06 MED ORDER — ACETAMINOPHEN 500 MG PO TABS: 500.0000 mg | ORAL_TABLET | Freq: Four times a day (QID) | ORAL | Status: AC | PRN

## 2023-07-06 NOTE — Progress Notes (Signed)
Occupational Therapy Treatment Patient Details Name: Erica Day MRN: [id number] DOB: 03-24-1941 Today's Date: 07/06/2023   History of present illness 82 y.o. female presents to [facility] 07/01/23 after losing her balance and falling at home. Pt with L periprosthetic supracondylar distal femur fx s/p ORIF 11/15. PMHx: arthritis, HTN, Sjorgren's syndrome, B TKA, neuropathy   OT comments  Patient asking to use BSC upon entry with patient requiring mod assist to get to EOB. Patient instructed on hand placement and mod assist to transfer to [facility]. Patient urinated during transfer and washed UB/LB while seated on BSC and changed gown. Patient stood for toilet hygiene with max assist to perform and transferred back to bed. Patient will benefit from continued inpatient follow up therapy, <3 hours/day to continue to address bathing, dressing, and toilet transfers. Acute OT to continue to follow.       If plan is discharge home, recommend the following:  A little help with walking and/or transfers;A lot of help with bathing/dressing/bathroom;Assistance with cooking/housework;Assist for transportation;Help with stairs or ramp for entrance   Equipment Recommendations  BSC/3in1;Tub/shower bench    Recommendations for Other Services      Precautions / Restrictions Precautions Precautions: Fall Restrictions Weight Bearing Restrictions: Yes LLE Weight Bearing: Weight bearing as tolerated       Mobility Bed Mobility Overal bed mobility: Needs Assistance Bed Mobility: Supine to Sit, Sit to Supine     Supine to sit: HOB elevated, Used rails, Mod assist Sit to supine: Mod assist, HOB elevated, Used rails   General bed mobility comments: assistance with BLEs and scooting to EOB    Transfers Overall transfer level: Needs assistance Equipment used: Rolling walker (2 wheels) Transfers: Sit to/from Stand, Bed to chair/wheelchair/BSC Sit to Stand: Mod assist     Step pivot transfers: Mod assist      General transfer comment: transfer performed to [facility] and back to EOB with assistance for walker management and pivoting     Balance Overall balance assessment: Needs assistance Sitting-balance support: Feet supported, No upper extremity supported Sitting balance-Leahy Scale: Fair Sitting balance - Comments: sitting EOB   Standing balance support: Bilateral upper extremity supported, During functional activity, Reliant on assistive device for balance Standing balance-Leahy Scale: Poor Standing balance comment: reliant on RW,                           ADL either performed or assessed with clinical judgement   ADL Overall ADL's : Needs assistance/impaired     Grooming: Supervision/safety;Sitting   Upper Body Bathing: Supervision/ safety;Sitting   Lower Body Bathing: Moderate assistance;Sit to/from stand Lower Body Bathing Details (indicate cue type and reason): patient able to bathe upper legs and peri area seated and required assistance for toilet hygiene standing Upper Body Dressing : Sitting;Minimal assistance Upper Body Dressing Details (indicate cue type and reason): to change gown     Toilet Transfer: Moderate assistance;BSC/3in1;Rolling walker (2 wheels) Toilet Transfer Details (indicate cue type and reason): assistance with managing RW and pivoting Toileting- Clothing Manipulation and Hygiene: Maximal assistance;Sit to/from stand              Extremity/Trunk Assessment              Vision       Perception     Praxis      Cognition Arousal: Alert Behavior During Therapy: WFL for tasks assessed/performed Overall Cognitive Status: Within Functional Limits for tasks assessed  Exercises      Shoulder Instructions       General Comments      Pertinent Vitals/ Pain       Pain Assessment Pain Assessment: Faces Faces Pain Scale: Hurts even more Pain Location: L LE Pain Descriptors  / Indicators: Aching, Discomfort, Sore Pain Intervention(s): Limited activity within patient's tolerance, Monitored during session, Repositioned  Home Living                                          Prior Functioning/Environment              Frequency  Min 1X/week        Progress Toward Goals  OT Goals(current goals can now be found in the care plan section)  Progress towards OT goals: Progressing toward goals  Acute Rehab OT Goals Patient Stated Goal: get stronger OT Goal Formulation: With patient Time For Goal Achievement: 07/17/23 Potential to Achieve Goals: Good ADL Goals Pt Will Perform Grooming: standing;with contact guard assist Pt Will Perform Lower Body Bathing: with set-up;sitting/lateral leans Pt Will Perform Lower Body Dressing: with min assist;sitting/lateral leans Pt Will Transfer to Toilet: stand pivot transfer;with contact guard assist;bedside commode  Plan      Co-evaluation                 AM-PAC OT "6 Clicks" Daily Activity     Outcome Measure   Help from another person eating meals?: None Help from another person taking care of personal grooming?: A Little Help from another person toileting, which includes using toliet, bedpan, or urinal?: A Lot Help from another person bathing (including washing, rinsing, drying)?: A Lot Help from another person to put on and taking off regular upper body clothing?: A Little Help from another person to put on and taking off regular lower body clothing?: Total 6 Click Score: 15    End of Session Equipment Utilized During Treatment: Gait belt;Rolling walker (2 wheels)  OT Visit Diagnosis: Unsteadiness on feet (R26.81);Other abnormalities of gait and mobility (R26.89);Muscle weakness (generalized) (M62.81);History of falling (Z91.81);Pain Pain - Right/Left: Left Pain - part of body: Leg   Activity Tolerance Patient tolerated treatment well   Patient Left in bed;with call bell/phone  within reach;with bed alarm set   Nurse Communication Mobility status        Time: 5956-3875 OT Time Calculation (min): 20 min  Charges: OT General Charges $OT Visit: 1 Visit OT Treatments $Self Care/Home Management : 8-22 mins  Alfonse Flavors, OTA Acute Rehabilitation Services  Office [phone]   Dewain Penning 07/06/2023, 2:35 PM

## 2023-07-06 NOTE — Plan of Care (Signed)

## 2023-07-06 NOTE — Discharge Instructions (Signed)

## 2023-07-06 NOTE — TOC Transition Note (Addendum)
Transition of Care [facility]) - CM/SW Discharge Note   Patient Details  Name: Erica Day MRN: [id number] Date of Birth: [birth year]  Transition of Care [facility]) CM/SW Contact:  Lorri Frederick, LCSW Phone Number: 07/06/2023, 2:27 PM   Clinical Narrative:   Pt discharging to [facility].  RN call report to [phone].  CSW confirmed with [facility] that they can receive pt today   Final next level of care: Skilled Nursing Facility Barriers to Discharge: Barriers Resolved   Patient Goals and CMS Choice CMS Medicare.gov Compare Post Acute Care list provided to:: Patient Represenative (must comment) (daughter Minus Liberty)    Discharge Placement                Patient chooses bed at: [facility] Patient to be transferred to facility by: ptar Name of family member notified: daughter Minus Liberty Patient and family notified of of transfer: 07/06/23  Discharge Plan and Services Additional resources added to the After Visit Summary for   In-house Referral: Clinical Social Work   Post Acute Care Choice: Skilled Nursing Facility                               Social Determinants of Health (SDOH) Interventions SDOH Screenings   Food Insecurity: No Food Insecurity (07/02/2023)  Housing: Low Risk  (07/02/2023)  Transportation Needs: No Transportation Needs (07/02/2023)  Utilities: Not At Risk (07/02/2023)  Social Connections: Unknown (12/30/2021)   Received from [facility]  Tobacco Use: Low Risk  (07/01/2023)     Readmission Risk Interventions     No data to display

## 2023-07-06 NOTE — Progress Notes (Signed)
Report called to [place], LPN

## 2023-07-06 NOTE — Discharge Summary (Signed)
Physician Discharge Summary  Erica Day YQM:[id number] DOB: 04/09/1941  PCP: [facility]  Admitted from: Home Discharged to: SNF  Admit date: 07/01/2023 Discharge date: 07/06/2023  Recommendations for Outpatient Follow-up:    Contact information for follow-up providers     Day, Erica Manners, MD. Schedule an appointment as soon as possible for a visit in 2 week(s).   Specialty: Orthopedic Surgery Why: Postop follow-up. Contact information: [address] [place] [address] [phone]         MD at SNF. Schedule an appointment as soon as possible for a visit.   Why: To seen in 2 to 3 days with repeat labs (CBC & BMP).        [facility]. Schedule an appointment as soon as possible for a visit.   Specialty: Family Medicine Why: To be seen upon discharge from SNF. Contact information: [address] Erica Day [facility] [place] [address] [phone]              Contact information for after-discharge care     Destination     [facility] Preferred SNF .   Service: Skilled Nursing Contact information: [address] [place] [address] [phone]                      Home Health: None    Equipment/Devices: TBD at [facility]    Discharge Condition: Improved and stable.   Code Status: Full Code Diet recommendation:  Discharge Diet Orders (From admission, onward)     Start     Ordered   07/06/23 0000  Diet - low sodium heart healthy        07/06/23 1248             Discharge Diagnoses:  Principal Problem:   Other fracture of left femur, initial encounter for closed fracture [facility]) Active Problems:   Sjogren's syndrome (HCC)   Fall   Cardiac murmur   Elevated BP without diagnosis of hypertension   Abnormal CT scan, cervical spine   Brief Summary: 82 year old female, with her daughter, ambulates with the help of a cane or a walker, medical history  significant for diffuse B-cell lymphoma, Sjogren syndrome, anemia, C. difficile, GERD, HTN, B/L TKR, presented to ED following a fall at home when she was walking with her walker, fell to the left side and noted left leg swelling and pain.  Admitted for left periprosthetic supracondylar distal femur fracture.  Orthopedics consulted and she underwent ORIF on 11/15.  Postop course complicated by agitated delirium-improved.     Assessment & Plan:    Left periprosthetic supracondylar distal femur fracture.   Sustained after a ground-level mechanical fall at home Orthopedics consulted and she underwent ORIF on 11/15. As per orthopedics: WBAT on LLE, Lovenox for DVT prophylaxis while hospitalized and Eliquis 2.5 Mg twice daily x 30 days at discharge.  2 weeks office follow-up with Dr. Jena Day. Multimodality pain control.  Minimize opioids.  Not requiring a lot of pain meds. PT and OT evaluate did not recommend SNF.  Patient and family agreeable. Had an episode of urinary retention greater than 24 hours ago requiring In-N-Out catheterization but since then voiding without difficulty. Patient was more confused yesterday morning, may be multifactorial delirium but daughter suspects that this may be due to withdrawal of not getting her home dose of Restoril.  Even though Restoril is listed as as needed at bedtime, patient reportedly takes  it on a scheduled basis.  Restarted this medication last night with significant improvement in her mental status which reportedly is approaching her baseline.   Postop delirium Multifactorial: Advanced age, pain, pain meds, hospital delirium etc. and as noted above,?  Restoril withdrawal Delirium precautions.  Minimize opioids.   As discussed above, significantly improved and mental status approaching baseline per daughter at bedside.   Acute blood loss anemia Hemoglobin dropped from 13.5 to the 9 g range but has been stable for several days. EBL 100 mL Likely due to  fracture and surgery.   Thrombocytopenia Appears to be new. Possibly from acute blood loss.  Continues to improve Follow CBC in a couple of days at [facility]   Stage IIIb CKD Creatinine in Care Everywhere on 9/25: 1.24 and on 6/12: 1.32 Interestingly creatinine here was 0.93 on 11/14 and up to 1.27.  Creatinine down to 1.07 >0.98. Avoid nephrotoxics.   Mechanical fall CT head and C-spine without acute abnormalities. Other injuries as noted above. PT and OT evaluation, SNF recommended.   Essential hypertension Controlled on metoprolol 50 Mg twice daily and Imdur 30 mg daily. Admitting MD had obtained 2D echo due to murmur heard on exam.  Preserved LVEF, indeterminate diastolic function, no significant valvular issues.   Hyperlipidemia Continue Zocor   Sjogren's syndrome Stable.  Restarted hydroxychloroquine and folate supplements.   Abnormal CT scan, cervical spine Radiology noted possible lesion could be secondary to metastatic disease.  Patient has known history of lymphoma although there will be less likely she has been actively followed by oncology.  Admitting MD discussed at length with family and recommended follow-up closely with oncology.  Patient may need repeat PET scan.  Also follow-up with PCP to further coordinate this.   Insomnia As per daughter's report, patient takes scheduled Restoril 30 mg at bedtime, ordered.   Acute urinary retention: Required In-N-Out catheterization early 11/18 morning as per daughter.  Mobilize.  Minimize opioids.  Restarted home dose of Myrbetriq.   Resolved.   GERD Continue prior home dose of Protonix and also on H2 blockers.  Intermittent diarrhea Reportedly has this when she eats certain things and hence is closely watchful of what she eats.  Had up to 3 episodes of loose/watery BM, improved after Imodium. Continue as needed Imodium Added probiotics which reportedly has helped her in the past.   Body mass index is 20.37 kg/m.    Nutritional Status Nutrition Problem: Inadequate oral intake Etiology: decreased appetite Signs/Symptoms: per patient/family report Interventions: Ensure Enlive (each supplement provides 350kcal and 20 grams of protein)   Confirmed with daughter that patient no longer takes Granix, discontinued from home med list.     Consultants:   Orthopedics   Procedures:   As above.  Discharge Instructions  Discharge Instructions     Call MD for:  difficulty breathing, headache or visual disturbances   Complete by: As directed    Call MD for:  extreme fatigue   Complete by: As directed    Call MD for:  persistant dizziness or light-headedness   Complete by: As directed    Call MD for:  persistant nausea and vomiting   Complete by: As directed    Call MD for:  redness, tenderness, or signs of infection (pain, swelling, redness, odor or green/yellow discharge around incision site)   Complete by: As directed    Call MD for:  severe uncontrolled pain   Complete by: As directed    Call MD for:  temperature >  100.4   Complete by: As directed    Diet - low sodium heart healthy   Complete by: As directed    Discharge instructions   Complete by: As directed    As per orthopedics: WBAT on LLE   Increase activity slowly   Complete by: As directed         Medication List     STOP taking these medications    Tbo-Filgrastim 480 MCG/0.8ML Sosy injection Commonly known as: GRANIX       TAKE these medications    acetaminophen 500 MG tablet Commonly known as: TYLENOL Take 1 tablet (500 mg total) by mouth every 6 (six) hours as needed for mild pain (pain score 1-3) or moderate pain (pain score 4-6).   apixaban 2.5 MG Tabs tablet Commonly known as: Eliquis Take 1 tablet (2.5 mg total) by mouth 2 (two) times daily.   Betimol 0.5 % ophthalmic solution Generic drug: timolol Place 1 drop into both eyes daily.   docusate sodium 100 MG capsule Commonly known as: COLACE Take 100 mg by  mouth as needed for mild constipation.   folic acid 1 MG tablet Commonly known as: FOLVITE Take 1 mg by mouth daily.   gabapentin 400 MG capsule Commonly known as: NEURONTIN Take 400 mg by mouth at bedtime.   hydroxychloroquine 200 MG tablet Commonly known as: PLAQUENIL Take 200 mg by mouth daily.   isosorbide mononitrate 30 MG 24 hr tablet Commonly known as: IMDUR Take 1 tablet by mouth daily.   lidocaine-prilocaine cream Commonly known as: EMLA Apply 1 application topically as needed (port).   loperamide 2 MG capsule Commonly known as: IMODIUM Take 1 capsule (2 mg total) by mouth 3 (three) times daily as needed for up to 3 days for diarrhea or loose stools.   magnesium oxide 400 MG tablet Commonly known as: MAG-OX Take 400 mg by mouth daily.   metoprolol tartrate 50 MG tablet Commonly known as: LOPRESSOR Take 50 mg by mouth 2 (two) times daily.   mirabegron ER 50 MG Tb24 tablet Commonly known as: MYRBETRIQ Take 50 mg by mouth at bedtime.   multivitamin capsule Take 1 capsule by mouth daily.   nystatin powder Commonly known as: MYCOSTATIN/NYSTOP Apply 1 application topically 2 (two) times daily as needed (under breast and back).   ondansetron 8 MG tablet Commonly known as: ZOFRAN Take 8 mg by mouth every 8 (eight) hours as needed for nausea or vomiting.   pantoprazole 40 MG tablet Commonly known as: PROTONIX Take 1 tablet by mouth daily.   potassium chloride SA 20 MEQ tablet Commonly known as: KLOR-CON M Take 20 mEq by mouth every other day.   ranitidine 150 MG tablet Commonly known as: ZANTAC Take 150 mg by mouth 2 (two) times daily.   saccharomyces boulardii 250 MG capsule Commonly known as: FLORASTOR Take 1 capsule (250 mg total) by mouth 2 (two) times daily for 14 days.   simvastatin 20 MG tablet Commonly known as: ZOCOR Take 20 mg by mouth at bedtime.   temazepam 30 MG capsule Commonly known as: RESTORIL Take 1 capsule (30 mg total) by  mouth at bedtime. What changed:  when to take this reasons to take this   tiZANidine 2 MG tablet Commonly known as: ZANAFLEX Take 1 tablet (2 mg total) by mouth every 8 (eight) hours as needed for muscle spasms.   traMADol 50 MG tablet Commonly known as: ULTRAM Take 1 tablet (50 mg total) by mouth every 6 (  six) hours as needed for severe pain (pain score 7-10). What changed:  when to take this reasons to take this   Travoprost (BAK Free) 0.004 % Soln ophthalmic solution Commonly known as: TRAVATAN Place 1 drop into both eyes at bedtime.       Allergies  Allergen Reactions   Benylin Cough [Diphenhydramine] Anaphylaxis and Shortness Of Breath   Codeine Nausea And Vomiting    Tolerates tramadol   Tape Other (See Comments)    Pt states makes her skin burn  Transparent dressing causes a rash per patient - Please use mepilex   Aloe Vera Rash    Turn to sores   Penicillins Rash   Sertraline Anxiety   Sulfa Antibiotics Rash      Procedures/Studies: ECHOCARDIOGRAM COMPLETE  Result Date: 07/03/2023    ECHOCARDIOGRAM REPORT   Patient Name:   Erica Day Date of Exam: 07/03/2023 Medical Rec #:  [id number]     Height:       67.0 in Accession #:    [id number]    Weight:       130.1 lb Date of Birth:  1941/07/14      BSA:          1.684 m Patient Age:    82 years      BP:           126/61 mmHg Patient Gender: F             HR:           70 bpm. Exam Location:  Inpatient Procedure: 2D Echo, Color Doppler and Cardiac Doppler Indications:    R01.1 Murmur  History:        Patient has prior history of Echocardiogram examinations, most                 recent 07/01/2020. Risk Factors:Hypertension.  Sonographer:    Irving Burton Senior RDCS Referring Phys: [id number] ANASTASSIA DOUTOVA  Sonographer Comments: Some windows limited by thin body habitus, femur fracture IMPRESSIONS  1. Left ventricular ejection fraction, by estimation, is 60 to 65%. The left ventricle has normal function. The left ventricle has no  regional wall motion abnormalities. Left ventricular diastolic parameters are indeterminate.  2. Right ventricular systolic function is normal. The right ventricular size is normal. Tricuspid regurgitation signal is inadequate for assessing PA pressure.  3. Left atrial size was mild to moderately dilated.  4. The mitral valve is degenerative. Trivial mitral valve regurgitation. No evidence of mitral stenosis. Severe mitral annular calcification.  5. The aortic valve is tricuspid. There is mild calcification of the aortic valve. Aortic valve regurgitation is trivial. Aortic valve sclerosis is present, with no evidence of aortic valve stenosis.  6. The inferior vena cava is normal in size with <50% respiratory variability, suggesting right atrial pressure of 8 mmHg. FINDINGS  Left Ventricle: Left ventricular ejection fraction, by estimation, is 60 to 65%. The left ventricle has normal function. The left ventricle has no regional wall motion abnormalities. The left ventricular internal cavity size was normal in size. There is  no left ventricular hypertrophy. Left ventricular diastolic function could not be evaluated due to mitral annular calcification (moderate or greater). Left ventricular diastolic parameters are indeterminate. Right Ventricle: The right ventricular size is normal. No increase in right ventricular wall thickness. Right ventricular systolic function is normal. Tricuspid regurgitation signal is inadequate for assessing PA pressure. Left Atrium: Left atrial size was mild to moderately dilated. Right Atrium: Right atrial size was  normal in size. Pericardium: There is no evidence of pericardial effusion. Mitral Valve: The mitral valve is degenerative in appearance. Severe mitral annular calcification. Trivial mitral valve regurgitation. No evidence of mitral valve stenosis. The mean mitral valve gradient is 2.4 mmHg. Tricuspid Valve: The tricuspid valve is normal in structure. Tricuspid valve  regurgitation is trivial. Aortic Valve: The aortic valve is tricuspid. There is mild calcification of the aortic valve. Aortic valve regurgitation is trivial. Aortic valve sclerosis is present, with no evidence of aortic valve stenosis. Pulmonic Valve: The pulmonic valve was grossly normal. Pulmonic valve regurgitation is mild. Aorta: The aortic root is normal in size and structure and the ascending aorta was not well visualized. Venous: The inferior vena cava is normal in size with less than 50% respiratory variability, suggesting right atrial pressure of 8 mmHg. IAS/Shunts: The atrial septum is grossly normal.  LEFT VENTRICLE PLAX 2D LVIDd:         3.50 cm   Diastology LVIDs:         2.30 cm   LV e' medial:    4.68 cm/s LV PW:         0.90 cm   LV E/e' medial:  20.8 LV IVS:        0.90 cm   LV e' lateral:   4.57 cm/s LVOT diam:     1.80 cm   LV E/e' lateral: 21.3 LV SV:         47 LV SV Index:   28 LVOT Area:     2.54 cm  RIGHT VENTRICLE RV S prime:     10.80 cm/s TAPSE (M-mode): 1.9 cm LEFT ATRIUM             Index        RIGHT ATRIUM           Index LA diam:        3.80 cm 2.26 cm/m   RA Area:     12.90 cm LA Vol (A2C):   31.9 ml 18.94 ml/m  RA Volume:   32.70 ml  19.41 ml/m LA Vol (A4C):   49.3 ml 29.27 ml/m LA Biplane Vol: 39.2 ml 23.27 ml/m  AORTIC VALVE LVOT Vmax:   87.00 cm/s LVOT Vmean:  67.700 cm/s LVOT VTI:    0.183 m  AORTA Ao Root diam: 3.10 cm MITRAL VALVE MV Area (PHT): 3.33 cm     SHUNTS MV Mean grad:  2.4 mmHg     Systemic VTI:  0.18 m MV Decel Time: 228 msec     Systemic Diam: 1.80 cm MV E velocity: 97.50 cm/s MV A velocity: 122.00 cm/s MV E/A ratio:  0.80 Weston Brass MD Electronically signed by Weston Brass MD Signature Date/Time: 07/03/2023/1:20:15 PM    Final    DG FEMUR PORT MIN 2 VIEWS LEFT  Result Date: 07/02/2023 CLINICAL DATA:  Postop. EXAM: LEFT FEMUR PORTABLE 2 VIEWS COMPARISON:  Preoperative imaging. FINDINGS: Lateral plate and multi screw fixation of comminuted  distal femur fracture. Improved fracture alignment from preoperative imaging. Left knee arthroplasty is again seen. Recent postsurgical change includes air and edema in the soft tissues. There are prominent vascular calcifications. Fluffy calcifications projecting over the knee joint space are unchanged from preoperative exam. IMPRESSION: ORIF of comminuted distal femur fracture. Electronically Signed   By: Narda Rutherford M.D.   On: 07/02/2023 13:42   DG FEMUR MIN 2 VIEWS LEFT  Result Date: 07/02/2023 CLINICAL DATA:  Elective surgery. EXAM: LEFT FEMUR 2  VIEWS COMPARISON:  Preoperative imaging FINDINGS: Seven fluoroscopic spot views of the left femur obtained in the operating room. Lateral plate and screw fixation of distal femur fracture. Previous knee arthroplasty. Fluoroscopy time 60.3 seconds. Dose 1.03 mGy. IMPRESSION: Intraoperative fluoroscopy during distal femur fracture fixation. Electronically Signed   By: Narda Rutherford M.D.   On: 07/02/2023 13:40   DG C-Arm 1-60 Min-No Report  Result Date: 07/02/2023 Fluoroscopy was utilized by the requesting physician.  No radiographic interpretation.   DG CHEST PORT 1 VIEW  Result Date: 07/01/2023 CLINICAL DATA:  244010 Preop cardiovascular exam [id number] EXAM: PORTABLE CHEST 1 VIEW COMPARISON:  Chest x-ray 02/07/2018 FINDINGS: Reversed right total shoulder arthroplasty. The heart and mediastinal contours are within normal limits. Atherosclerotic plaque Right base atelectasis. Increased interstitial markings, peribronchial cuffing, Kerley B lines. No pleural effusion. No pneumothorax. No acute osseous abnormality. IMPRESSION: 1. Pulmonary edema. 2.  Aortic Atherosclerosis (ICD10-I70.0). Electronically Signed   By: Tish Frederickson M.D.   On: 07/01/2023 23:22   CT Head Wo Contrast  Result Date: 07/01/2023 CLINICAL DATA:  Head trauma, minor (Age >= 65y); Neck trauma (Age >= 65y) EXAM: CT HEAD WITHOUT CONTRAST CT CERVICAL SPINE WITHOUT CONTRAST  TECHNIQUE: Multidetector CT imaging of the head and cervical spine was performed following the standard protocol without intravenous contrast. Multiplanar CT image reconstructions of the cervical spine were also generated. RADIATION DOSE REDUCTION: This exam was performed according to the departmental dose-optimization program which includes automated exposure control, adjustment of the mA and/or kV according to patient size and/or use of iterative reconstruction technique. COMPARISON:  None Available. FINDINGS: CT HEAD FINDINGS Brain: No hemorrhage. No hydrocephalus. No extra-axial fluid collection. No CT evidence of an acute cortical infarct. No mass effect. No mass lesion. There is sequela of moderate overall chronic microvascular ischemic change. 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 Skull: Normal. Negative for fracture or focal lesion. Sinuses/Orbits: No middle ear or mastoid effusion. Paranasal sinuses are clear. Bilateral lens replacement. Orbits are otherwise unremarkable. Other: None. CT CERVICAL SPINE FINDINGS Alignment: Grade 1 anterolisthesis of C5 on C6. Skull base and vertebrae: No acute fracture. There is a nonspecific hyperdense lesion in the C5 vertebral body level. Recommend comparison with prior imaging to exclude the possibility of osseous metastatic disease Soft tissues and spinal canal: No prevertebral fluid or swelling. No visible canal hematoma. Disc levels:  No evidence of high-grade spinal canal stenosis. Upper chest: Negative. Other: None IMPRESSION: 1. No acute intracranial abnormality. 2. No acute fracture or traumatic subluxation of the cervical spine. 3. Nonspecific hyperdense lesion in the C5 vertebral body level. Recommend comparison with prior imaging to exclude the possibility of osseous metastatic disease. Electronically Signed   By: Lorenza Cambridge M.D.   On: 07/01/2023 19:36   CT Cervical Spine Wo  Contrast  Result Date: 07/01/2023 CLINICAL DATA:  Head trauma, minor (Age >= 65y); Neck trauma (Age >= 65y) EXAM: CT HEAD WITHOUT CONTRAST CT CERVICAL SPINE WITHOUT CONTRAST TECHNIQUE: Multidetector CT imaging of the head and cervical spine was performed following the standard protocol without intravenous contrast. Multiplanar CT image reconstructions of the cervical spine were also generated. RADIATION DOSE REDUCTION: This exam was performed according to the departmental dose-optimization program which includes automated exposure control, adjustment of the mA and/or kV according to patient size and/or use of iterative reconstruction technique. COMPARISON:  None Available. FINDINGS: CT HEAD FINDINGS Brain: No hemorrhage. No hydrocephalus. No extra-axial fluid collection. No  CT evidence of an acute cortical infarct. No mass effect. No mass lesion. There is sequela of moderate overall chronic microvascular ischemic change. 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 Skull: Normal. Negative for fracture or focal lesion. Sinuses/Orbits: No middle ear or mastoid effusion. Paranasal sinuses are clear. Bilateral lens replacement. Orbits are otherwise unremarkable. Other: None. CT CERVICAL SPINE FINDINGS Alignment: Grade 1 anterolisthesis of C5 on C6. Skull base and vertebrae: No acute fracture. There is a nonspecific hyperdense lesion in the C5 vertebral body level. Recommend comparison with prior imaging to exclude the possibility of osseous metastatic disease Soft tissues and spinal canal: No prevertebral fluid or swelling. No visible canal hematoma. Disc levels:  No evidence of high-grade spinal canal stenosis. Upper chest: Negative. Other: None IMPRESSION: 1. No acute intracranial abnormality. 2. No acute fracture or traumatic subluxation of the cervical spine. 3. Nonspecific hyperdense lesion in the C5 vertebral body level. Recommend  comparison with prior imaging to exclude the possibility of osseous metastatic disease. Electronically Signed   By: Lorenza Cambridge M.D.   On: 07/01/2023 19:36   DG Tibia/Fibula Left  Result Date: 07/01/2023 CLINICAL DATA:  Pain after fall. EXAM: LEFT TIBIA AND FIBULA - 2 VIEW COMPARISON:  None Available. FINDINGS: No evidence of lower leg fracture. Knee arthroplasty is intact were visualized. Distal femur fracture is partially included in the field of view. Ankle alignment is intact, distal most tibia and fibula not included in the AP view. Prominent vascular calcifications. IMPRESSION: No lower leg fracture. Electronically Signed   By: Narda Rutherford M.D.   On: 07/01/2023 19:33   DG Femur Min 2 Views Left  Result Date: 07/01/2023 CLINICAL DATA:  Left femoral deformity after fall, pain distally. EXAM: LEFT FEMUR 2 VIEWS COMPARISON:  None Available. FINDINGS: Comminuted, angulated and displaced distal femur fracture. No convincing fracture extension to the knee arthroplasty. Femoral shaft and proximal femur are intact. Advanced vascular calcifications. IMPRESSION: Comminuted, angulated and displaced distal femur fracture. No convincing involvement of the knee arthroplasty Electronically Signed   By: Narda Rutherford M.D.   On: 07/01/2023 19:32   DG Pelvis 1-2 Views  Result Date: 07/01/2023 CLINICAL DATA:  Pain after fall. EXAM: PELVIS - 1-2 VIEW COMPARISON:  None Available. FINDINGS: The bones are subjectively under mineralized. The cortical margins of the bony pelvis are intact. No fracture. Pubic symphysis and sacroiliac joints are congruent. Both femoral heads are well-seated in the respective acetabula. No hip dislocation. Mild bilateral hip osteoarthritis. Prominent vascular calcifications. IMPRESSION: 1. No pelvic fracture. 2. Bilateral hip osteoarthritis. Electronically Signed   By: Narda Rutherford M.D.   On: 07/01/2023 19:31      Subjective: Patient is alert and oriented x 2.  Did not  complain of pain.  As per daughter at bedside, mental status significantly improved.  Both of them slipped while last night.  She thinks that her mental status changes yesterday were due to withdrawal from not getting bedtime Restoril the days prior.  Eating little.  3 loose to watery BMs yesterday, better after Imodium.  Reviewed home med list with her at bedside.  Discharge Exam:  Vitals:   07/05/23 0812 07/05/23 2216 07/06/23 0613 07/06/23 0808  BP: (!) 140/65 130/64 126/77 (!) 115/56  Pulse: 97 93 84 79  Resp: 18 18 19 17   Temp: 97.7 F (36.5 C) 98 F (36.7 C) (!) 97.3 F (36.3 C) 98.5 F (36.9 C)  TempSrc: Oral Oral  Oral Oral  SpO2: 98% 97% 98% 99%  Weight:      Height:        General exam: Elderly female, small built and moderately nourished and up comfortably in bed without distress.  Appears to be in good spirits. Respiratory system: Clear to auscultation.  No increased work of breathing. Cardiovascular system: S1 & S2 heard, RRR. No JVD, rubs, gallops or clicks. No pedal edema.?  Soft systolic murmur at apex.  Off of telemetry. Gastrointestinal system: Abdomen is nondistended, soft and nontender. No organomegaly or masses felt. Normal bowel sounds heard. Central nervous system: Alert and oriented x 2.  No focal deficits. Extremities: Symmetric 5 x 5 power.  Left leg surgical site, clean and dry without acute findings. Skin: No rashes, lesions or ulcers Psychiatry: Judgement and insight improved.  No longer appears to be confused.  Pleasant and cooperative.  Not agitated.    The results of significant diagnostics from this hospitalization (including imaging, microbiology, ancillary and laboratory) are listed below for reference.     Microbiology: Recent Results (from the past 240 hour(s))  Surgical PCR screen     Status: Abnormal   Collection Time: 07/02/23 12:18 AM   Specimen: Nasal Mucosa; Nasal Swab  Result Value Ref Range Status   MRSA, PCR NEGATIVE NEGATIVE Final    Staphylococcus aureus POSITIVE (A) NEGATIVE Final    Comment: (NOTE) The Xpert SA Assay (FDA approved for NASAL specimens in patients 22 years of age and older), is one component of a comprehensive surveillance program. It is not intended to diagnose infection nor to guide or monitor treatment. Performed at [facility], [address] [place], [place] [address]      Labs: CBC: Recent Labs  Lab 07/02/23 0050 07/02/23 1453 07/03/23 0532 07/04/23 0600 07/05/23 0808 07/06/23 0752  WBC 7.7 7.7 8.5 6.2 8.5 6.2  NEUTROABS 6.7  --   --   --   --   --   HGB 13.5 11.5* 9.2* 9.8* 9.7* 9.6*  HCT 40.3 35.8* 28.0* 29.4* 29.6* 29.3*  MCV 95.5 100.8* 95.6 97.7 94.9 96.1  PLT 139* 106* 117* 110* 132* 144*    Basic Metabolic Panel: Recent Labs  Lab 07/01/23 2038 07/01/23 2212 07/02/23 1453 07/03/23 0532 07/04/23 0600  NA 139  --   --  141 144  K 4.1  --   --  3.9 3.8  CL 107  --   --  105 109  CO2 22  --   --  26 27  GLUCOSE 166*  --   --  136* 95  BUN 33*  --   --  28* 25*  CREATININE 0.93  --  1.27* 1.07* 0.98  CALCIUM 9.0  --   --  8.8* 8.9  MG  --  1.8  --   --   --   PHOS  --  4.2  --   --   --     Liver Function Tests: Recent Labs  Lab 07/01/23 2212  AST 41  ALT 34  ALKPHOS 25*  BILITOT 0.9  PROT 5.8*  ALBUMIN 3.5    CBG: Recent Labs  Lab 07/04/23 2111  GLUCAP 129*   Discussed in detail with patient's daughter at bedside, updated care and answered all questions.   Time coordinating discharge: 45 minutes  SIGNED:  Marcellus Scott, MD,  FACP, [facility], [facility], [facility]   [facility] [place]     To contact the  attending provider between 7A-7P or the covering provider during after hours 7P-7A, please log into the web site [email or web address] and access using universal [place] password for that web site. If you do not have the password, please call the hospital operator.

## 2023-07-07 NOTE — Progress Notes (Signed)
At 2027,the patient was picked by [facility] to the patients nursing home.AVS printed and given to the [facility] people

## 2024-02-23 NOTE — Progress Notes (Signed)
 Subjective :  Patient ID: Erica Day is a 83 y.o. female.  HPI: ROOPA GRAVER comes today for f/u HTN with CKD 3a, Primary insomnia, Mixed, Hyperlipidemia, and Hereditary and idiopathic neuropathy.  She denies dysphagia, painful swallowing, N/V, abdominal pain, hematochezia or melena. No CP, SOB or leg pain with exertion. No PND, orthopnea, lightheadedness, presyncope or syncope. No wheezing, cough.   The following portions of the patient's history were reviewed and updated as appropriate: allergies, current medications, past family history, past medical history, past social history, past surgical history, and problem list.  Review of Systems  Constitutional:  Negative for chills, fatigue and fever.  HENT:  Negative for ear pain, hearing loss, sinus pressure and sore throat.   Eyes:  Negative for visual disturbance.  Respiratory:  Negative for chest tightness and shortness of breath.   Cardiovascular:  Negative for chest pain, palpitations and leg swelling.  Gastrointestinal:  Negative for abdominal pain, blood in stool, diarrhea, nausea and vomiting.  Endocrine: Negative for cold intolerance, heat intolerance, polydipsia, polyphagia and polyuria.  Genitourinary:  Negative for dysuria.  Musculoskeletal:  Negative for back pain and myalgias.  Skin:  Negative for rash.  Neurological:  Negative for dizziness, syncope, light-headedness and headaches.  Psychiatric/Behavioral:  Negative for behavioral problems.     Objective  Physical Exam Vitals and nursing note reviewed.  Constitutional:      Appearance: Normal appearance.     Comments: Sitting comfortably on a wheelchair.   HENT:     Head: Normocephalic and atraumatic.   Eyes:     Extraocular Movements: Extraocular movements intact.     Conjunctiva/sclera: Conjunctivae normal.     Pupils: Pupils are equal, round, and reactive to light.   Neck:     Vascular: No carotid bruit.   Cardiovascular:     Rate and Rhythm: Normal  rate and regular rhythm.     Heart sounds: Normal heart sounds. No murmur heard.    No gallop.  Pulmonary:     Effort: Pulmonary effort is normal.     Breath sounds: Normal breath sounds. No wheezing or rhonchi.  Abdominal:     General: Bowel sounds are normal.     Palpations: Abdomen is soft.     Tenderness: There is no abdominal tenderness.   Musculoskeletal:        General: Normal range of motion.     Cervical back: Normal range of motion and neck supple. No rigidity or tenderness.     Right lower leg: No edema.     Left lower leg: No edema.  Lymphadenopathy:     Cervical: No cervical adenopathy.   Skin:    General: Skin is warm.     Findings: No erythema.   Neurological:     Mental Status: She is alert and oriented to person, place, and time. Mental status is at baseline.     Gait: Gait abnormal.   Psychiatric:        Mood and Affect: Mood normal.        Behavior: Behavior normal.        Assessment    1. 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 She stated that her blood pressure is 120/70 at home. She is taking Metoprolol  tartrate 50 mg bid. It was normal last month during her app. With Nephrology.   PLAN: Continue current treatment.  2. Primary insomnia  Controlled with Temazepam  30 mg at  bedtime prn  PLAN:  Continue current treatment  3. Mixed hyperlipidemia  Controlled with Simvastatin  20 mg nightly  PLAN:  Continue current treatment  4. Hereditary and idiopathic neuropathy, unspecified   Controlled with Gabapentin  400 mg at bedtime.  PLAN:  Continue current treatment   Plan  See above.  This document serves as a record of services personally performed by Dr. Delilah. It was created on their behalf by Marylynn Tedi Arts, CMA, a trained medical scribe, and Certified Medical Assistance (CMA). During the course of documenting the history, physical exam and medical  decision making, I was functioning as a Stage manager. The creation of this record is the provider's dictation and/or activities during the visit.  Electronically signed by Marylynn Tedi Arts, CMA 02/23/2024 11:36 AM

## 2024-03-15 NOTE — Progress Notes (Signed)
 [facility] INFECTIOUS DISEASE OUTPATIENT CONSULT NOTE ([facility])  Erica Day Date of Birth: 02/01/1941 MRN: [id number] Patient seen and examined by me on 03/16/2024   Erica Day is being seen in consultation at the request of Dr.Lufadeju  for evaluation of recurrent urinary tract infection   Assessment: 1.  Recurrent urinary tract infection 2.  Urge incontinence   Plan: She has had a history of recurrent urinary tract infections.  She also has urge incontinence.  No obstructive uropathy on CT imaging and cystoscopy was negative.  Most recently treated for an ESBL E. coli urinary tract infection with Macrobid.  She had significant side effects with antibiotic therapy but currently without any symptoms.  I have recommended methenamine 1 g twice daily.  I have recommended that she continue with the vaginal estrogen.  Discussed perineal hygiene.  Recommended adequate fluid intake and recommended avoiding constipation.  Discussed with nephrology   She will follow-up with me in 3 months     Please note that those non-infectious problems listed above as active problems directly impact the patient's risk for infection, ability to respond to treatment and clear infections, or impact the selection or dosing of antibiotics, or are directly influenced by infection, treatment of infection, or relate to monitoring of infection and/or potential toxicities of antimicrobial therapy. Subjective:  HPI: Erica Day is a 83 y.o. 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  She also has a history of hypertension, lumbar radiculopathy.  She has had a history of recurrent acute kidney injury and chronic NSAID use.  She has urinary incontinence.   She has been followed by urology in the past.  CT imaging on 03/26/2022 did not show obstructive uropathy.  She was noted to have renal cysts.  She has undergone cystoscopy in in 2024 which did not show any abnormalities.  She notes that she has had 6-8 urinary tract infections every year.  Most recently treated for an ESBL E. coli urinary tract infection with Macrobid with resolution of symptoms but that gave her significant side effects.   Medical History[1] Surgical History[2]  Medications: Current antibiotics/Antibiotic history: Drug: Start Date: End date: Duration/Other:                       Current Medications[3]   Allergies: Adhesive, Diphenhydramine hcl, Codeine, Surgical tape, Tetanus vaccines and toxoid, Aloe vera, Penicillin, Sertraline, and Sulfa (sulfonamide antibiotics)   Social History:   Social History   Tobacco Use  . Smoking status: Never    Passive exposure: Past  . Smokeless tobacco: Never  Substance Use Topics  . Alcohol use: No     Family History: Family History[4] I have reviewed family history and updated as appropriate  Immunizations: Immunization History  Administered Date(s) Administered  . Covid-19 Vaccine Unspecified 11/30/2019, 12/21/2019, 08/05/2020  . Influenza Vaccine, Quadrivalent, Adjuvanted 05/21/2021, 05/29/2022  . Influenza, High-dose Seasonal, Quadrivalent, Preservative Free 05/14/2019  . Influenza, high-dose, trivalent, PF 05/26/2017, 05/09/2018, 06/13/2020, 05/10/2023  . Pfizer SARS-CoV-2 Bivalent 12+ yrs 06/23/2021, 05/29/2022  . Pfizer SARS-CoV-2 Primary Series 12+ yrs 11/30/2019, 12/21/2019, 08/05/2020  . Pneumococcal Conjugate 13-Valent 10/19/2018  . Pneumococcal Polysaccharide Vaccine, 23 Valent (PNEUMOVAX-23) 2Y+ 07/09/2020    Review of Systems Gen: Denies fevers chills, denies  HEENT: Denies sore throat or difficulty swallowing, denies vision problems Neck: Denies Neck pain or neck stiffness Cardiovascular: Denies  chest pain or palpitations Respiratory: Denies cough, denies productive sputum.  Denies chest pain, Denies SOB Gastrointestinal: Denies diarrhea.  Denies vomiting.  Denies abdominal pain Genitourinary: Denies dysuria, denies hematuria.  Denies urethral discharge Skin: Denies skin rash denies.  Denies itching Musculoskeletal: Denies joint pain denies joint swelling.  Denies pain or redness in the extremities CNS: Denies headache.  Denies vision problems.  Denies weakness or numbness in any extremity Endocrine: Denies increased urinary frequency.  Denies fatigue Hematological: Denies bleeding tendency  Objective:  Physical Examination: There is no height or weight on file to calculate BMI.  Vitals:   03/16/24 1553 03/16/24 1559  BP: 149/67 147/67  BP Location: Left arm   Patient Position: Sitting   Pulse: 59   Resp: 18   Temp: 98.3 F (36.8 C)   TempSrc: Oral   SpO2: 96%   Weight: 54.4 kg (120 lb)   Height: 1.372 m (4' 6)       General appearance - alert, well appearing, and in no distress and oriented to person, place, and time Mouth - mucous membranes moist, pharynx normal without lesions. Neck  -supple Chest - clear to auscultation, no wheezes, rales or rhonchi, symmetric air entry Heart - normal rate and regular rhythm Abdomen - soft, nontender, nondistended, no masses or organomegaly Neurological - alert, oriented, normal speech, no focal findings or movement disorder noted Extremities - no pedal edema note Musculoskeletal-no joint swelling Endocrine: No thyroid swelling Psych: mood and affect are appropriate    Labs: No results found for this or any previous visit (from the past week)..    Results   reviewed    Microbiology: @[id number] @  Imaging: ( personally reviewed by me):   No results found.                                                                                               I have personally reviewed the Radiology report as noted:  above I have independently reviewed the images as noted: above I have personally reviewed the diagnostic tests reports (biospy, smear, culture, etc.) as noted: above     Tawanna Sheen, MD [facility]-Infectious Disease- [facility] [phone]   This record has been created using Dragon voice recognition software.         [1] Past Medical History: Diagnosis Date  . A-fib    (CMD)   . Acute renal failure (ARF) 02/07/2018  . Allergy   . Anemia   . Antiphospholipid syndrome (  CMD)   . Anxiety   . Arthritis   . Cancer    (CMD)   . Cataract   . Depression   . 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 GERD (gastroesophageal reflux disease)   . Glaucoma   . Gout   . Hiatal hernia   . History of chemotherapy   . Hypercholesterolemia   . Hypertension   . Hypertension with goal to be determined   . Murmur, cardiac 05/14/2020  . NHL (non-Hodgkin's lymphoma)    (CMD)   . Osteoporosis   . RA (rheumatoid arthritis)    (CMD)   . Sjogren's syndrome (CMD)   . Stomach acid   . Transfusion history   . Urge urinary incontinence 08/26/2018  [2] Past Surgical History: Procedure Laterality Date  . APPENDECTOMY     Procedure: APPENDECTOMY  . BIOPSY SKIN / SQ / MUCOUS MEMBRANE Left 01/18/2018   Procedure: Nasal Mass Biopsy;  Surgeon: Alm Edsel Georgina Mickey., MD;  Location: [facility];  Service: ENT;  Laterality: Left;  . CAUDAL EPIDURAL STEROID INJECTION N/A 03/27/2019   Procedure: CAUDLE EPIDURAL STEROID INJECTION if difficult -> consider Bil S1;  Surgeon: Joane Fairy Cave, MD;  Location: [facility];  Service: [facility] Physiatry;  Laterality: N/A;  . CHOLECYSTECTOMY     Procedure: CHOLECYSTECTOMY  . HYSTERECTOMY      Procedure: HYSTERECTOMY  . INSERT / REPLACE / VENOUS ACCESS CATHETER     Procedure: PORTA CATH  . JOINT REPLACEMENT     Procedure: JOINT REPLACEMENT  . OTHER SURGICAL HISTORY     Procedure: OTHER SURGICAL HISTORY  (bilateral knee replacement)  . OTHER SURGICAL HISTORY     Procedure: OTHER SURGICAL HISTORY (right foot surgery)  . OTHER SURGICAL HISTORY     Procedure: OTHER SURGICAL HISTORY (cateract surgery)  . REDUCTION MAMMAPLASTY     Procedure: REDUCTION MAMMAPLASTY  . TONSILLECTOMY AND ADENOIDECTOMY     Procedure: TONSILLECTOMY AND ADENOIDECTOMY  . TOTAL SHOULDER ARTHROPLASTY     Procedure: TOTAL SHOULDER REPLACEMENT  [3]  Current Outpatient Medications:  .  fluconazole (DIFLUCAN) 150 mg tablet, See Admin Instructions. PLEASE SEE ATTACHED FOR DETAILED DIRECTIONS (Patient not taking: Reported on 02/23/2024), Disp: , Rfl:  .  folic acid  (FOLVITE ) 1 mg tablet, Take 1 mg by mouth Once Daily., Disp: 30 tablet, Rfl: 11 .  gabapentin  (NEURONTIN ) 400 mg capsule, Take 1 capsule (400 mg total) by mouth nightly., Disp: 90 capsule, Rfl: 3 .  hydroxychloroquine  (PLAQUENIL ) 200 mg tablet, Take 1 tablet (200 mg total) by mouth daily., Disp: 90 tablet, Rfl: 3 .  isosorbide  mononitrate (IMDUR ) 30 mg 24 hr tablet, Take 1 tablet (30 mg total) by mouth daily., Disp: 90 tablet, Rfl: 3 .  latanoprost  (XALATAN ) 0.005 % ophthalmic solution, , Disp: , Rfl:  .  magnesium  oxide 400 mg (241 mg magnesium ) tab, Take 400 mg by mouth 2 (two) times a day., Disp: , Rfl:  .  meloxicam (MOBIC) 7.5 mg tablet, Take 1 tablet (7.5 mg total) by mouth daily., Disp: 30 tablet, Rfl: 0 .  methocarbamoL  (ROBAXIN ) 500 mg tablet, TAKE 0.5-1 TABLET BY MOUTH TWICE A DAY AS NEEDED FOR MUSCLE SPASMS, Disp: , Rfl:  .  metoprolol  tartrate (LOPRESSOR ) 50 mg tablet, TAKE 1 TABLET BY MOUTH TWICE  DAILY, Disp: 180 tablet, Rfl: 3 .  mirabegron  (Myrbetriq ) 50 mg Tb24, TAKE 1 TABLET BY MOUTH DAILY, Disp: 90 tablet, Rfl: 0 .  nystatin (MYCOSTATIN) 100,000  unit/gram ointment, APPLY 1 APPLICATION ON THE SKIN THREE TIMES A DAY FOR TEN DAYS, Disp: , Rfl:  .  nystatin (MYCOSTATIN) cream, Apply  topically 2 (two) times a day., Disp: 30 g, Rfl: 1 .  pantoprazole   (PROTONIX ) 40 mg EC tablet, TAKE 1 TABLET BY MOUTH DAILY, Disp: 90 tablet, Rfl: 3 .  phenazopyridine (PYRIDIUM) 100 mg tablet, Take 100 mg by mouth 3 (three) times a day as needed (pain)., Disp: 10 tablet, Rfl: 0 .  simvastatin  (ZOCOR ) 20 mg tablet, TAKE 1 TABLET BY MOUTH EVERY  NIGHT, Disp: 90 tablet, Rfl: 3 .  temazepam  (RESTORIL ) 30 mg capsule, TAKE 1 CAPSULE BY MOUTH EVERY  NIGHT AS NEEDED FOR SLEEP, Disp: 90 capsule, Rfl: 0 .  timolol  (TIMOPTIC ) 0.5 % ophthalmic solution, INSTILL 1 DROP INTO EACH EYE IN THE MORNING, Disp: , Rfl:  .  traMADoL  (ULTRAM ) 50 mg tablet, Take 1 tablet (50 mg total) by mouth every 8 (eight) hours as needed for moderate pain (4-6)., Disp: 60 tablet, Rfl: 2 .  travoprost (TRAVATAN Z) 0.004 % drop, Administer 1 drop into each eyes nightly., Disp: , Rfl:  .  valACYclovir (VALTREX) 1 gram tablet, Take 1,000 mg by mouth 3 (three) times a day for 7 days., Disp: 21 tablet, Rfl: 0 .  zinc oxide (DESITIN-AQUAPHOR) ointment, Apply 1 Application topically 4 (four) times a day., Disp: 120 g, Rfl: 0 [4] Family History Problem Relation Name Age of Onset  . Hypertension Mother    . Osteoporosis Mother    . Diabetes Father    . Heart failure Father    . Hearing loss Sister    . Sjogren's syndrome Daughter    . Colon cancer Neg Hx    . Colon polyps Neg Hx

## 2024-04-04 NOTE — Progress Notes (Signed)
 CLINIC:  Cancer Survivorship  REASON FOR VISIT:  Treatments summary visit & to address acute survivorship needs   ONCOLOGY HISTORY: Oncology History  History of diffuse large B-cell lymphoma  01/2017 Initial Diagnosis   01/2017 - first noticed swelling of her left cheek/neck, unresponsive to antibiotics when she was living in [place]    02/15/17 - CT neck and ultrasound left cervical chain lymphadenopathy with the largest measuring 3.7 x 2.9 x1.8 cm.  Also noted was a 1.7 x 1.4 x 1.2 cm left thyroid lobe mass  03/04/17 - She underwent FNA of the thyroid mass which was reportedly benign  03/05/17 - FNA of left neck adenopathy monotypic B-cell population consistent with non-hodgkin B-cell lymphoma, recommended excisional biopsy for subclassification.  04/01/17 - PET - avid masslike area in the left nasal region, 1.1 x 2.3 cm with an SUV of 14.4.  Also noted was an enlarged nodal conglomerate in the left neck measuring 3 x 4.8 cm with an SUV of 14.4, and a large nodal conglomerate in the right axilla measuring 3.5 x 7.3 cm with and SUV of 34.4  Labs at the time of diagnosis: WBC 4.5, hemoglobin 11.3 normal MCV of 92.0, platelet count of 247 with a normal differential.  LDH elevated at 326.  Creatinine within normal limits.  Uric acid elevated at 9.0    She noted she had an episode of gout in the past 1-2 months and was initiated on allopurinol  100 mg daily.   04/29/2017 Biopsy   Repeat axillary biopsy - diffuse large B cell lymphoma, double hit mutations negative.   04/30/2017 - 09/15/2017 Chemotherapy    R-miniCHOP x 6 cycles, with intrathecal methotrexate starting with cycle 3 for 4 doses.       10/05/2017 Imaging   PET/CT demonstrated complete response to treatment.    01/11/2018 Relapse   PET - a new hypermetabolic mass within the anterior left nasal cavity with an SUV max of 8.2 measuring 3.3 x 2.3 cm.   01/18/2018 Biopsy   biopsy of the left nare mass -DLBCL, double expressor.  Double  hit mutations negative.   02/25/2018 - 03/30/2018 Radiation   Involved field radiation to the left nasal mass.  45 Gy in 25 fractions.    06/22/2018 Imaging   End of radiation PET/CT scan consistent with complete response to treatment.   DLBCL (diffuse large B cell lymphoma)    (CMD) (Resolved)  04/29/2017 Biopsy   Repeat axillary biopsy - diffuse large B cell lymphoma, double hit mutations negative.   04/30/2017 Initial Diagnosis   DLBCL (diffuse large B cell lymphoma)    (CMD)   02/25/2018 - 03/30/2018 Radiation   Involved field radiation to the left nasal mass.  45 Gy in 25 fractions.    06/22/2018 Imaging   End of radiation PET/CT scan consistent with complete response to treatment.     INTERVAL HISTORY / REVIEW OF SYSTEMS:  Interval History Recent (since last visit) PCP Visit?: Yes Recent hospital admission?: No Recent imaging (x-rays or scans)?: Yes Any change in family cancer history?: No  Survivorship Health Maintenance Following immunizations reviewed with patient?: Flu (within 1 year), TDAP (within 10 years), Pneumococcal (within 5 years), Covid-19 (#2) Immunization Activity updated?: Yes      Primary Concerns: I saw Erica Day today in ongoing follow up.  She is accompanied by her supportive daughter.  She denies any recent night sweats, new lymphadenopathy, swelling around her face.  Appetite is good and weight is stable.  They have been discussing transitioning her PCP care to geriatrics clinic.  PAST MEDICAL & SURGICAL HISTORY:  Past Medical History:  Diagnosis Date  . A-fib    (CMD)   . Acute renal failure (ARF) 02/07/2018  . Allergy   . Anemia   . Antiphospholipid syndrome (CMD)   . Anxiety   . Arthritis   . Cancer    (CMD)   . Cataract   . Depression   . 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 GERD (gastroesophageal reflux disease)   . Glaucoma   . Gout   . Hiatal hernia   . History of chemotherapy   . Hypercholesterolemia   .  Hypertension   . Hypertension with goal to be determined   . Murmur, cardiac 05/14/2020  . NHL (non-Hodgkin's lymphoma)    (CMD)   . Osteoporosis   . RA (rheumatoid arthritis)    (CMD)   . Sjogren's syndrome (CMD)   . Stomach acid   . Transfusion history   . Urge urinary incontinence 08/26/2018    Past Surgical History:  Procedure Laterality Date  . APPENDECTOMY     Procedure: APPENDECTOMY  . BIOPSY SKIN / SQ / MUCOUS MEMBRANE Left 01/18/2018   Procedure: Nasal Mass Biopsy;  Surgeon: Alm Edsel Georgina Mickey., MD;  Location: [facility];  Service: ENT;  Laterality: Left;  . CAUDAL EPIDURAL STEROID INJECTION N/A 03/27/2019   Procedure: CAUDLE EPIDURAL STEROID INJECTION if difficult -> consider Bil S1;  Surgeon: Joane Fairy Cave, MD;  Location: [facility];  Service: [facility];  Laterality: N/A;  . CHOLECYSTECTOMY     Procedure: CHOLECYSTECTOMY  . HYSTERECTOMY      Procedure: HYSTERECTOMY  . INSERT / REPLACE / VENOUS ACCESS CATHETER     Procedure: PORTA CATH  . JOINT REPLACEMENT     Procedure: JOINT REPLACEMENT  . OTHER SURGICAL HISTORY     Procedure: OTHER SURGICAL HISTORY (bilateral knee replacement)  . OTHER SURGICAL HISTORY     Procedure: OTHER SURGICAL HISTORY (right foot surgery)  . OTHER SURGICAL HISTORY     Procedure: OTHER SURGICAL HISTORY (cateract surgery)  . REDUCTION MAMMAPLASTY     Procedure: REDUCTION MAMMAPLASTY  . TONSILLECTOMY AND ADENOIDECTOMY     Procedure: TONSILLECTOMY AND ADENOIDECTOMY  . TOTAL SHOULDER ARTHROPLASTY     Procedure: TOTAL SHOULDER REPLACEMENT     SOCIAL HISTORY:  Social History   Tobacco Use  . Smoking status: Never    Passive exposure: Past  . Smokeless tobacco: Never  Vaping Use  . Vaping status: Never Used  Substance Use Topics  . Alcohol use: No  . Drug use: No     CURRENT MEDICATIONS:  Current Outpatient Medications on File Prior to Visit  Medication Sig Dispense Refill  . folic acid  (FOLVITE ) 1 mg  tablet Take 1 mg by mouth Once Daily. 30 tablet 11  . gabapentin  (NEURONTIN ) 400 mg capsule Take 1 capsule (400 mg total) by mouth nightly. 90 capsule 3  . hydroxychloroquine  (PLAQUENIL ) 200 mg tablet Take 1 tablet (200 mg total) by mouth daily. 90 tablet 3  . isosorbide  mononitrate (IMDUR ) 30 mg 24 hr tablet Take 1 tablet (30 mg total) by mouth daily. 90 tablet 3  . latanoprost  (XALATAN ) 0.005 % ophthalmic solution     . magnesium  oxide 400 mg (241 mg magnesium ) tab Take 400 mg by mouth 2 (two) times a day.    . meloxicam (MOBIC) 7.5 mg tablet Take  1 tablet (7.5 mg total) by mouth daily. 30 tablet 0  . metoprolol  tartrate (LOPRESSOR ) 50 mg tablet TAKE 1 TABLET BY MOUTH TWICE  DAILY 180 tablet 3  . mirabegron  (Myrbetriq ) 50 mg Tb24 TAKE 1 TABLET BY MOUTH DAILY 90 tablet 0  . nystatin (MYCOSTATIN) 100,000 unit/gram ointment APPLY 1 APPLICATION ON THE SKIN THREE TIMES A DAY FOR TEN DAYS    . nystatin (MYCOSTATIN) cream Apply  topically 2 (two) times a day. 30 g 1  . pantoprazole  (PROTONIX ) 40 mg EC tablet TAKE 1 TABLET BY MOUTH DAILY 90 tablet 3  . phenazopyridine (PYRIDIUM) 100 mg tablet Take 100 mg by mouth 3 (three) times a day as needed (pain). 10 tablet 0  . simvastatin  (ZOCOR ) 20 mg tablet TAKE 1 TABLET BY MOUTH EVERY  NIGHT 90 tablet 3  . temazepam  (RESTORIL ) 30 mg capsule TAKE 1 CAPSULE BY MOUTH EVERY  NIGHT AS NEEDED FOR SLEEP 90 capsule 0  . timolol  (TIMOPTIC ) 0.5 % ophthalmic solution INSTILL 1 DROP INTO EACH EYE IN THE MORNING    . traMADoL  (ULTRAM ) 50 mg tablet Take 1 tablet (50 mg total) by mouth every 8 (eight) hours as needed for moderate pain (4-6). 60 tablet 2  . travoprost (TRAVATAN Z) 0.004 % drop Administer 1 drop into each eyes nightly.    . valACYclovir (VALTREX) 1 gram tablet Take 1,000 mg by mouth 3 (three) times a day for 7 days. (Patient not taking: Reported on 03/16/2024) 21 tablet 0  . zinc oxide (DESITIN-AQUAPHOR) ointment Apply 1 Application topically 4 (four) times a  day. 120 g 0  . fluconazole (DIFLUCAN) 150 mg tablet See Admin Instructions. PLEASE SEE ATTACHED FOR DETAILED DIRECTIONS (Patient not taking: Reported on 03/16/2024)    . methocarbamoL  (ROBAXIN ) 500 mg tablet TAKE 0.5-1 TABLET BY MOUTH TWICE A DAY AS NEEDED FOR MUSCLE SPASMS (Patient not taking: Reported on 03/16/2024)     No current facility-administered medications on file prior to visit.     ALLERGIES: Allergies  Allergen Reactions  . Adhesive Other (See Comments)    Pt states makes her skin burn , Transparent dressing causes a rash per patient - Please use mepilex  . Diphenhydramine Hcl Anaphylaxis and Shortness Of Breath  . Codeine GI Intolerance  . Surgical Tape Other (See Comments)    Pt states makes her skin burn   Transparent dressing causes a rash per patient - Please use mepilex  . Tetanus Vaccines And Toxoid Hives  . Aloe Vera Rash  . Penicillin Rash  . Sertraline Angioedema  . Sulfa (Sulfonamide Antibiotics) Rash    PHYSICAL EXAM:  Vitals:   02/15/24 1459  BP: 152/63  Pulse: 62  Resp: 16  Temp: 97.8 F (36.6 C)  SpO2: 100%   Wt Readings from Last 3 Encounters:  03/16/24 54.4 kg (120 lb)  02/23/24 54.7 kg (120 lb 9.6 oz)  02/01/24 54.4 kg (120 lb)     General: female in no acute distress.   HEENT: Head is atraumatic and normocephalic. Pupils equal and reactive to light. Conjunctivae clear without exudate.  Sclerae anicteric. Oral mucosa is pink and moist without lesions. Oropharynx is pink and moist, without lesions. Lymph: No cervical, supraclavicular, or infraclavicular lymphadenopathy noted on palpation, no axillary or inguinal adenopathy.   Cardiovascular: Normal rate and rhythm Respiratory: Clear to auscultation bilaterally. Chest expansion symmetric without accessory muscle use. Breathing non-labored.   Abdominal: Soft, non-tender, no palpable hepatosplenomegaly  Neuro: No focal deficits. Steady gait.  Psych: Normal mood and affect for  situation. Extremities: No edema, cyanosis, or clubbing.   Skin: Warm and dry.   LABORATORY DATA:  Recent Results (from the past 8 weeks)  Comprehensive Metabolic Panel   Collection Time: 02/15/24  2:44 PM  Result Value Ref Range   Sodium 143 136 - 145 mmol/L   Potassium 3.9 3.4 - 4.5 mmol/L   Chloride 105 98 - 107 mmol/L   CO2 34 (H) 21 - 31 mmol/L   Anion Gap 4 (L) 6 - 14 mmol/L   Glucose, Random 110 (H) 70 - 99 mg/dL   Blood Urea Nitrogen (BUN) 31 (H) 7 - 25 mg/dL   Creatinine 9.11 9.39 - 1.20 mg/dL   eGFR 64 >40 fO/fpw/8.26f7   Albumin 3.9 3.5 - 5.7 g/dL   Total Protein 6.1 (L) 6.4 - 8.9 g/dL   Bilirubin, Total 0.5 0.3 - 1.0 mg/dL   Alkaline Phosphatase (ALP) 26 (L) 34 - 104 U/L   Aspartate Aminotransferase (AST) 27 13 - 39 U/L   Alanine Aminotransferase (ALT) 18 7 - 52 U/L   Calcium 9.1 8.6 - 10.3 mg/dL   BUN/Creatinine Ratio    Lactate Dehydrogenase (LDH)   Collection Time: 02/15/24  2:44 PM  Result Value Ref Range   Lactate Dehydrogenase (LDH) 244 140 - 271 U/L  CBC with Differential   Collection Time: 02/15/24  2:44 PM  Result Value Ref Range   WBC 4.10 (L) 4.40 - 11.00 10*3/uL   RBC 4.02 (L) 4.10 - 5.10 10*6/uL   Hemoglobin 12.9 12.3 - 15.3 g/dL   Hematocrit 61.9 64.0 - 44.6 %   Mean Corpuscular Volume (MCV) 94.6 80.0 - 96.0 fL   Mean Corpuscular Hemoglobin (MCH) 32.1 27.5 - 33.2 pg   Mean Corpuscular Hemoglobin Conc (MCHC) 34.0 33.0 - 37.0 g/dL   Red Cell Distribution Width (RDW) 13.6 12.3 - 17.0 %   Platelet Count (PLT) 150 150 - 450 10*3/uL   Mean Platelet Volume (MPV) 6.8 6.8 - 10.2 fL   Neutrophils % 61 %   Lymphocytes % 30 %   Monocytes % 8 %   Eosinophils % 0 %   Basophils % 1 %   nRBC % 0 %   Neutrophils Absolute 2.50 1.80 - 7.80 10*3/uL   Lymphocytes # 1.20 1.00 - 4.80 10*3/uL   Monocytes # 0.30 0.00 - 0.80 10*3/uL   Eosinophils # 0.00 0.00 - 0.50 10*3/uL   Basophils # 0.00 0.00 - 0.20 10*3/uL   nRBC Absolute 0.00 <=0.00 10*3/uL  Urine Culture    Collection Time: 02/23/24 12:15 PM   Specimen: Clean Catch; Urine  Result Value Ref Range   Urine Culture >100,000 CFU/mL Escherichia coli ESBL (A)       Susceptibility   Escherichia coli ESBL - MIC    MALDI ID       Amoxicillin + Clavulanate <=8/4 Susceptible ug/ml    Ampicillin >16 Resistant ug/ml    Ampicillin + Sulbactam <=4 Susceptible ug/ml    Cefazolin  >16 Resistant ug/ml    Cefepime <=2 Susceptible ug/ml    Cefotaxime 8 Resistant ug/ml    Ceftriaxone  8 Resistant ug/ml    Cefuroxime 8 Resistant ug/ml    Ciprofloxacin >2 Resistant ug/ml    Ertapenem <=0.5 Susceptible ug/ml    Gentamicin <=2 Susceptible ug/ml    Meropenem  <=1 Susceptible ug/ml    Nitrofurantoin <=32 Susceptible ug/ml    Piperacillin + Tazobactam <=8 Susceptible ug/ml    Tetracycline <=4 Susceptible ug/ml  Trimethoprim + Sulfamethoxazole <=0.5/9.5 Susceptible ug/ml     DIAGNOSTIC IMAGING:  No images are attached to the encounter.   ASSESSMENT & PLAN:  Erica Day is a pleasant 83 y.o. female with history of Diffuse large B cell lymphoma on 04/29/17 treated with 6 cycles of R-miniCHOP; completed treatment on 09/15/2017.  She had recurrent disease in the left nasal cavity on 01/11/2018 treated with radiation alone from 02/25/18 -03/30/18.  She has been in remission since that time.  Patient presents to survivorship clinic today for treatment summary visit and to address any acute survivorship concerns since completing treatment.   1. Lymphoma: She received a copy of her Survivorship Care Plan (SCP) document on 02/15/24, which was reviewed in detail.  The SCP details cancer treatment history and potential late/long-term side effects of those treatments.  We discussed the follow-up schedule she can anticipate with interval imaging for surveillance.  I have also shared a copy of this treatment summary with her PCP.  She has no clinical evidence of recurrent disease at this time.  We plan no repeat imaging unless new  symptoms arise.  She will need to be followed annually with a CBC with differential and will need to be monitored for cardiac toxicities given prior exposure to anthracycline.    2. Health promotion/Cancer screening:  Erica Day is reportedly up-to-date on her Pap smear, skin screenings, immunizations, mammogram, colonoscopy, dexa scan, and TSH.  I encouraged her to talk with his PCP about arranging appropriate cancer screening tests, as appropriate.   Dispo:  Follow up in 1 year with labs   Electronically signed by: Camie Lamarr Eva Boyce, PA-C [facility]

## 2024-05-05 NOTE — Progress Notes (Signed)
 Patient name: Erica Day  Date of birth: [birth year] Date of visit: 05/05/2024 Referring Provider: No ref. provider found   Primary Care Provider: Murray CHRISTELLA Amos, MD                                                Chief Complaint:  Chief Complaint  Patient presents with  . Follow-up    6 month     Impression /Plan:    1.  Chest pain Continues. Patient with  episodes of  chest pain at rest rated 5/10 lasting minutes.  Will increase isosorbide  mononitrate  to  60 mg. 2.  Shortness of breath Continues.  Patient declines referral to pulmonary.  Patient to follow-up with PCP for further workup.   3.  Hypertension Stable.  No change in treatment.  Patient to continue on metoprolol .   4.  Chronic kidney disease stage III Known history.  Stable.  No change in treatment.    History Present Illness:   History of Present Illness The patient is an 83 year old female who presents for chest pain, shortness of breath, and blood pressure management. She is accompanied by her daughter.  She reports intermittent chest pain, which she describes as a sensation of pressure rather than sharp pain. The pain typically occurs when she is seated and rates it at a moderate level of 5 on a scale of 0 to 10. She has been experiencing this chest pain more frequently this month. She has been taking Imdur  daily, which she finds helpful in managing her chest pain.  She also mentions occasional shortness of breath, a symptom she has been dealing with for the past 6 to 7 years. She recalls undergoing some tests several years ago but did not complete the final step. She does not remember what kind of test it was, whether it was to check lung function or sleep apnea.  She monitors her blood pressure at home and notes that it can sometimes be high or low. She has been on blood pressure medication for over 20 years and has recently halved the dose.  She experienced an episode of internal trembling yesterday morning,  which lasted all day. This was not accompanied by dizziness. The trembling subsided after she consumed coffee today.  SOCIAL HISTORY Exercise: Running in the morning Coffee/Tea/Caffeine-containing Drinks: Drinks coffee     Past Medical History:  Medical History[1]   Social History:   Social History   Socioeconomic History  . Marital status: Widowed    Spouse name: Not on file  . Number of children: Not on file  . Years of education: Not on file  . Highest education level: Not on file  Occupational History  . Not on file  Tobacco Use  . Smoking status: Never    Passive exposure: Past  . Smokeless tobacco: Never  Vaping Use  . Vaping status: Never Used  Substance and Sexual Activity  . Alcohol use: No  . Drug use: No  . Sexual activity: Not on file  Other Topics Concern  . Not on file  Social History Narrative   Patient is originally from [place] and has lived in the [place] for over [place] years. She lived in [place]. Her husband passed in January 2018 and moved to [place] to live with her daughter in August 2018   Social Drivers of Health  Food Insecurity: Low Risk  (01/21/2024)   Food vital sign   . Within the past 12 months, you worried that your food would run out before you got money to buy more: Never true   . Within the past 12 months, the food you bought just didn't last and you didn't have money to get more: Never true  Transportation Needs: No Transportation Needs (01/21/2024)   Transportation   . In the past 12 months, has lack of reliable transportation kept you from medical appointments, meetings, work or from getting things needed for daily living? : No  Safety: Low Risk  (01/21/2024)   Safety   . How often does anyone, including family and friends, physically hurt you?: Never   . How often does anyone, including family and friends, insult or talk down to you?: Never   . How often does anyone, including family and friends, threaten you with harm?: Never   . How often does  anyone, including family and friends, scream or curse at you?: Never  Living Situation: Low Risk  (01/21/2024)   Living Situation   . What is your living situation today?: I have a steady place to live   . Think about the place you live. Do you have problems with any of the following? Choose all that apply:: None/None on this list    Family History :  Family History[2]   Current Medications: Current Medications[3]  Allergies:   Allergies[4]   Review Of  Systems:  Negative except recurrent shortness of breath, tremor and occasional chest pain History obtained from child, chart review, and the patient General ROS: negative Hematological and Lymphatic ROS: negative Endocrine ROS: Recurrent shortness of breath Respiratory ROS: positive for - shortness of breath Cardiovascular ROS: Recurrent chest pain Gastrointestinal ROS: no abdominal pain, change in bowel habits, or black or bloody stools Musculoskeletal ROS: negative  Vital Signs:  BP 126/69 (BP Location: Left arm, Patient Position: Sitting)   Pulse 57   Wt 52.2 kg (115 lb)   BMI 27.73 kg/m  Wt Readings from Last 3 Encounters:  05/05/24 52.2 kg (115 lb)  03/16/24 54.4 kg (120 lb)  02/23/24 54.7 kg (120 lb 9.6 oz)    Physical Examination Constitutional: See Vital Signs                          No Acute Distress Normal Body Habitus and development; patient in wheelchair.  Accompanied by her daughter Eyes:  Normal conjunctivae Ears Nose and Throat: Normal  Neck: No significant jugular venous distension.  Thyroid does not appear enlarged visually Respiratory:   No increased work of breathing; CTBA; No wheezes rhonchi or rales  Cardiovascular Exam:   No palpable thrills or lifts, PMI non displaced.  S1 S2   regular rate and  rhythm   No murmurs;  No rubs or gallop.   Normal Carotid upstroke, No Carotid Bruits  No abnormal abdominal pulsation or bruits  Pedal/radial Pulses - present  No significant edema or varicosities    Abdomen  Soft non tender;  non distended, + bowel sounds, No obvious HSM or masses Ext:  No clubbing or cyanosis  MSK:  Generally normal strength;   Neuro:  Alert and Oriented x 2  Grossly normal non focal, Normal mood and affect Psych:  Normal Mood and affect Skin: No rash      Diagnostic Studies:  Lab Results  Component Value Date   WBC 4.10 (L) 02/15/2024  HGB 12.9 02/15/2024   HCT 38.0 02/15/2024   PLT 150 02/15/2024    Lab Results  Component Value Date   NA 143 02/15/2024   K 3.9 02/15/2024   CL 105 02/15/2024   CO2 34 (H) 02/15/2024   BUN 31 (H) 02/15/2024   CREATININE 0.88 02/15/2024   GLUCOSE 110 (H) 02/15/2024   CALCIUM 9.1 02/15/2024   MG 1.8 06/11/2020   PHOS 4.4 01/07/2023    Lab Results  Component Value Date   BILITOT 0.5 02/15/2024   BILIDIR 0.1 01/15/2021   PROT 6.1 (L) 02/15/2024   ALBUMIN 3.9 02/15/2024   ALT 18 02/15/2024   AST 27 02/15/2024   ALP 26 (L) 02/15/2024    No results found for: LABPROT, INR, PTT  Lab Results  Component Value Date   CHOL 131 12/07/2023   CHOL 129 01/07/2023   CHOL 117 10/17/2021   Lab Results  Component Value Date   HDL 57 (L) 12/07/2023   HDL 50 (L) 01/07/2023   HDL 51 10/17/2021   Lab Results  Component Value Date   LDLCALC 56 12/07/2023   LDLDIRECT 52 10/17/2021   LDLDIRECT 58 07/15/2020   Lab Results  Component Value Date   TRIG 96 12/07/2023   TRIG 90 01/07/2023   TRIG 67 10/17/2021   Lab Results  Component Value Date   HDL 57 (L) 12/07/2023   HDL 50 (L) 01/07/2023   HDL 51 10/17/2021      Thank you for allowing me to participate in the care of your patient.  Please feel free to contact me ifyou need any further information.  Samandra Nadine Tann, NP       [1] Past Medical History: Diagnosis Date  . A-fib    (CMD)   . Acute renal failure (ARF) 02/07/2018  . Allergy   . Anemia   . Antiphospholipid syndrome (CMD)   . Anxiety   . Arthritis   . Cancer    (CMD)   .  Cataract   . Depression   . 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 GERD (gastroesophageal reflux disease)   . Glaucoma   . Gout   . Hiatal hernia   . History of chemotherapy   . Hypercholesterolemia   . Hypertension   . Hypertension with goal to be determined   . Murmur, cardiac 05/14/2020  . NHL (non-Hodgkin's lymphoma)    (CMD)   . Osteoporosis   . RA (rheumatoid arthritis)    (CMD)   . Sjogren's syndrome (CMD)   . Stomach acid   . Transfusion history   . Urge urinary incontinence 08/26/2018  [2] Family History Problem Relation Name Age of Onset  . Hypertension Mother    . Osteoporosis Mother    . Diabetes Father    . Heart failure Father    . Hearing loss Sister    . Sjogren's syndrome Daughter    . Colon cancer Neg Hx    . Colon polyps Neg Hx    [3] Current Outpatient Medications  Medication Sig Dispense Refill  . conjugated estrogens (Premarin) 0.625 mg/gram vaginal cream Insert 0.5 grams vaginally twice weekly. 30 g 1  . fluconazole (DIFLUCAN) 150 mg tablet See Admin Instructions. PLEASE SEE ATTACHED FOR DETAILED DIRECTIONS    . folic acid  (FOLVITE ) 1 mg tablet Take 1 mg by mouth Once Daily. 30 tablet 11  . gabapentin  (NEURONTIN ) 400 mg capsule Take 1 capsule (400 mg total) by  mouth nightly. 90 capsule 3  . hydroxychloroquine  (PLAQUENIL ) 200 mg tablet Take 1 tablet (200 mg total) by mouth daily. 90 tablet 3  . latanoprost  (XALATAN ) 0.005 % ophthalmic solution     . magnesium  oxide 400 mg (241 mg magnesium ) tab Take 400 mg by mouth 2 (two) times a day.    . meloxicam (MOBIC) 7.5 mg tablet Take 1 tablet (7.5 mg total) by mouth daily. 30 tablet 0  . methenamine (HIPREX) 1 gram tablet Take 1 g by mouth 2 (two) times a day. 60 tablet 3  . methocarbamoL  (ROBAXIN ) 500 mg tablet TAKE 0.5-1 TABLET BY MOUTH TWICE A DAY AS NEEDED FOR MUSCLE SPASMS    . metoprolol  tartrate (LOPRESSOR ) 50 mg tablet TAKE 1 TABLET BY MOUTH TWICE  DAILY 180 tablet 3  . mirabegron   (Myrbetriq ) 50 mg Tb24 TAKE 1 TABLET BY MOUTH DAILY 90 tablet 0  . nystatin (MYCOSTATIN) 100,000 unit/gram ointment APPLY 1 APPLICATION ON THE SKIN THREE TIMES A DAY FOR TEN DAYS    . nystatin (MYCOSTATIN) cream Apply  topically 2 (two) times a day. 30 g 1  . pantoprazole  (PROTONIX ) 40 mg EC tablet TAKE 1 TABLET BY MOUTH DAILY 90 tablet 3  . phenazopyridine (PYRIDIUM) 100 mg tablet Take 100 mg by mouth 3 (three) times a day as needed (pain). 10 tablet 0  . simvastatin  (ZOCOR ) 20 mg tablet TAKE 1 TABLET BY MOUTH EVERY  NIGHT 90 tablet 3  . temazepam  (RESTORIL ) 30 mg capsule TAKE 1 CAPSULE BY MOUTH EVERY  NIGHT AS NEEDED FOR SLEEP 90 capsule 0  . timolol  (TIMOPTIC ) 0.5 % ophthalmic solution INSTILL 1 DROP INTO EACH EYE IN THE MORNING    . traMADoL  (ULTRAM ) 50 mg tablet Take 1 tablet (50 mg total) by mouth every 8 (eight) hours as needed for moderate pain (4-6). 60 tablet 2  . travoprost (TRAVATAN Z) 0.004 % drop Administer 1 drop into each eyes nightly.    . valACYclovir (VALTREX) 1 gram tablet Take 1,000 mg by mouth 3 (three) times a day for 7 days. 21 tablet 0  . zinc oxide (DESITIN-AQUAPHOR) ointment Apply 1 Application topically 4 (four) times a day. 120 g 0  . isosorbide  mononitrate (IMDUR ) 30 mg 24 hr tablet Take 2 tablets (60 mg total) by mouth daily. 180 tablet 1   No current facility-administered medications for this visit.  [4] Allergies Allergen Reactions  . Adhesive Other (See Comments)    Pt states makes her skin burn , Transparent dressing causes a rash per patient - Please use mepilex  . Diphenhydramine Hcl Anaphylaxis and Shortness Of Breath  . Codeine GI Intolerance  . Surgical Tape Other (See Comments)    Pt states makes her skin burn   Transparent dressing causes a rash per patient - Please use mepilex  . Tetanus Vaccines And Toxoid Hives  . Aloe Vera Rash  . Penicillin Rash  . Sertraline Angioedema  . Sulfa (Sulfonamide Antibiotics) Rash

## 2024-05-26 ENCOUNTER — Encounter (HOSPITAL_COMMUNITY): Payer: Self-pay

## 2024-05-26 ENCOUNTER — Other Ambulatory Visit: Payer: Self-pay

## 2024-05-26 ENCOUNTER — Emergency Department (HOSPITAL_COMMUNITY)

## 2024-05-26 ENCOUNTER — Observation Stay (HOSPITAL_COMMUNITY)
Admission: EM | Admit: 2024-05-26 | Discharge: 2024-05-27 | Disposition: A | Discharge status: Home / Self Care | Attending: Internal Medicine | Admitting: Internal Medicine

## 2024-05-26 DIAGNOSIS — Z79899 Other long term (current) drug therapy: Secondary | ICD-10-CM | POA: Insufficient documentation

## 2024-05-26 DIAGNOSIS — E785 Hyperlipidemia, unspecified: Secondary | ICD-10-CM | POA: Diagnosis not present

## 2024-05-26 DIAGNOSIS — I10 Essential (primary) hypertension: Principal | ICD-10-CM

## 2024-05-26 DIAGNOSIS — R7989 Other specified abnormal findings of blood chemistry: Secondary | ICD-10-CM | POA: Insufficient documentation

## 2024-05-26 DIAGNOSIS — I2489 Other forms of acute ischemic heart disease: Secondary | ICD-10-CM | POA: Diagnosis not present

## 2024-05-26 DIAGNOSIS — H409 Unspecified glaucoma: Secondary | ICD-10-CM | POA: Insufficient documentation

## 2024-05-26 DIAGNOSIS — I480 Paroxysmal atrial fibrillation: Secondary | ICD-10-CM | POA: Diagnosis not present

## 2024-05-26 DIAGNOSIS — I160 Hypertensive urgency: Secondary | ICD-10-CM | POA: Diagnosis not present

## 2024-05-26 DIAGNOSIS — I169 Hypertensive crisis, unspecified: Secondary | ICD-10-CM | POA: Diagnosis not present

## 2024-05-26 DIAGNOSIS — M069 Rheumatoid arthritis, unspecified: Secondary | ICD-10-CM | POA: Insufficient documentation

## 2024-05-26 DIAGNOSIS — G8929 Other chronic pain: Secondary | ICD-10-CM | POA: Diagnosis not present

## 2024-05-26 DIAGNOSIS — R339 Retention of urine, unspecified: Secondary | ICD-10-CM | POA: Insufficient documentation

## 2024-05-26 DIAGNOSIS — R079 Chest pain, unspecified: Secondary | ICD-10-CM | POA: Diagnosis present

## 2024-05-26 LAB — COMPREHENSIVE METABOLIC PANEL WITH GFR
ALT: 27 U/L (ref 0–44)
AST: 36 U/L (ref 15–41)
Albumin: 3.8 g/dL (ref 3.5–5.0)
Alkaline Phosphatase: 23 U/L — ABNORMAL LOW (ref 38–126)
Anion gap: 9 (ref 5–15)
BUN: 22 mg/dL (ref 8–23)
CO2: 27 mmol/L (ref 22–32)
Calcium: 9.6 mg/dL (ref 8.9–10.3)
Chloride: 108 mmol/L (ref 98–111)
Creatinine, Ser: 0.97 mg/dL (ref 0.44–1.00)
GFR, Estimated: 57 mL/min — ABNORMAL LOW (ref >60)
Glucose, Bld: 119 mg/dL — ABNORMAL HIGH (ref 70–99)
Potassium: 4.7 mmol/L (ref 3.5–5.1)
Sodium: 144 mmol/L (ref 135–145)
Total Bilirubin: 0.8 mg/dL (ref 0.0–1.2)
Total Protein: 6.2 g/dL — ABNORMAL LOW (ref 6.5–8.1)

## 2024-05-26 LAB — CBC
HCT: 43.7 % (ref 36.0–46.0)
Hemoglobin: 14.5 g/dL (ref 12.0–15.0)
MCH: 32.2 pg (ref 26.0–34.0)
MCHC: 33.2 g/dL (ref 30.0–36.0)
MCV: 96.9 fL (ref 80.0–100.0)
Platelets: 144 K/uL — ABNORMAL LOW (ref 150–400)
RBC: 4.51 MIL/uL (ref 3.87–5.11)
RDW: 12.9 % (ref 11.5–15.5)
WBC: 5.6 K/uL (ref 4.0–10.5)
nRBC: 0 % (ref 0.0–0.2)

## 2024-05-26 LAB — TROPONIN I (HIGH SENSITIVITY)
Troponin I (High Sensitivity): 123 ng/L (ref <18)
Troponin I (High Sensitivity): 134 ng/L (ref <18)

## 2024-05-26 MED ORDER — NITROGLYCERIN IN D5W 200-5 MCG/ML-% IV SOLN
0.0000 ug/min | INTRAVENOUS | Status: DC
  Administered 2024-05-26: 5 ug/min via INTRAVENOUS
  Filled 2024-05-26: qty 250

## 2024-05-26 MED ORDER — ACETAMINOPHEN 500 MG PO TABS
1000.0000 mg | ORAL_TABLET | Freq: Once | ORAL | Status: AC
  Administered 2024-05-26: 1000 mg via ORAL
  Filled 2024-05-26: qty 2

## 2024-05-26 MED ORDER — AMLODIPINE BESYLATE 5 MG PO TABS
5.0000 mg | ORAL_TABLET | Freq: Every day | ORAL | Status: DC
  Administered 2024-05-26: 5 mg via ORAL
  Filled 2024-05-26: qty 1

## 2024-05-26 MED ORDER — CARVEDILOL 12.5 MG PO TABS
12.5000 mg | ORAL_TABLET | Freq: Two times a day (BID) | ORAL | Status: DC
  Administered 2024-05-26 – 2024-05-27 (×2): 12.5 mg via ORAL
  Filled 2024-05-26 (×2): qty 1

## 2024-05-26 MED ORDER — ASPIRIN 81 MG PO CHEW
324.0000 mg | CHEWABLE_TABLET | Freq: Once | ORAL | Status: AC
  Administered 2024-05-26: 324 mg via ORAL
  Filled 2024-05-26: qty 4

## 2024-05-26 NOTE — ED Provider Notes (Signed)
 [place] EMERGENCY DEPARTMENT AT [facility] Provider Note   CSN: [id number] Arrival date & time: 05/26/24  1038     Patient presents with: No chief complaint on file.   Erica Day is a 83 y.o. female.   83 year old female with prior medical history as detailed below presents for evaluation.  Patient complains of elevated blood pressure x 2 to 3 days.  She complains of associated headache.  She reports some transient chest pain last night.  She reports compliance with previously prescribed antihypertensives.  She denies active chest pain.  She reports that her headache is much improved at time of my evaluation.  She denies vomiting.  She denies fever.  She denies vision change or weakness.  The history is provided by the patient, medical records and a significant other. A language interpreter was used.       Prior to Admission medications   Medication Sig Start Date End Date Taking? Authorizing Provider  acetaminophen  (TYLENOL ) 500 MG tablet Take 1 tablet (500 mg total) by mouth every 6 (six) hours as needed for mild pain (pain score 1-3) or moderate pain (pain score 4-6). 07/06/23   Hongalgi, Anand D, MD  apixaban  (ELIQUIS ) 2.5 MG TABS tablet Take 1 tablet (2.5 mg total) by mouth 2 (two) times daily. 07/05/23 08/04/23  Danton Lauraine LABOR, PA-C  docusate sodium  (COLACE) 100 MG capsule Take 100 mg by mouth as needed for mild constipation.    [provider]  folic acid  (FOLVITE ) 1 MG tablet Take 1 mg by mouth daily. 04/20/17   [provider]  gabapentin  (NEURONTIN ) 400 MG capsule Take 400 mg by mouth at bedtime. 12/02/17   [provider]  hydroxychloroquine  (PLAQUENIL ) 200 MG tablet Take 200 mg by mouth daily.    [provider]  isosorbide  mononitrate (IMDUR ) 30 MG 24 hr tablet Take 1 tablet by mouth daily. 05/12/23 05/11/24  [provider]  lidocaine-prilocaine (EMLA) cream Apply 1 application topically as needed (port).      [provider]  magnesium  oxide (MAG-OX) 400 MG tablet Take 400 mg by mouth daily. 04/10/19   [provider]  metoprolol  tartrate (LOPRESSOR ) 50 MG tablet Take 50 mg by mouth 2 (two) times daily. 12/08/17   [provider]  mirabegron  ER (MYRBETRIQ ) 50 MG TB24 tablet Take 50 mg by mouth at bedtime.    [provider]  Multiple Vitamin (MULTIVITAMIN) capsule Take 1 capsule by mouth daily.    [provider]  nystatin (MYCOSTATIN/NYSTOP) powder Apply 1 application topically 2 (two) times daily as needed (under breast and back).     [provider]  ondansetron  (ZOFRAN ) 8 MG tablet Take 8 mg by mouth every 8 (eight) hours as needed for nausea or vomiting.  02/07/18   [provider]  pantoprazole  (PROTONIX ) 40 MG tablet Take 1 tablet by mouth daily. 05/29/20   [provider]  potassium chloride SA (K-DUR,KLOR-CON) 20 MEQ tablet Take 20 mEq by mouth every other day.    [provider]  ranitidine (ZANTAC) 150 MG tablet Take 150 mg by mouth 2 (two) times daily. 12/01/17   [provider]  simvastatin  (ZOCOR ) 20 MG tablet Take 20 mg by mouth at bedtime. 12/08/17   [provider]  temazepam  (RESTORIL ) 30 MG capsule Take 1 capsule (30 mg total) by mouth at bedtime. 07/06/23 02/03/29  Hongalgi, Anand D, MD  timolol  (BETIMOL ) 0.5 % ophthalmic solution Place 1 drop into both eyes daily.  [provider]  tiZANidine  (ZANAFLEX ) 2 MG tablet Take 1 tablet (2 mg total) by mouth every 8 (eight) hours as needed for muscle spasms. 07/05/23   Danton Lauraine LABOR, PA-C  traMADol  (ULTRAM ) 50 MG tablet Take 1 tablet (50 mg total) by mouth every 6 (six) hours as needed for severe pain (pain score 7-10). 07/05/23   Danton Lauraine LABOR, PA-C  Travoprost, BAK Free, (TRAVATAN) 0.004 % SOLN ophthalmic solution Place 1 drop into both eyes at bedtime.    [provider]    Allergies: Benylin cough [diphenhydramine],  Codeine, Tape, Aloe vera, Penicillins, Sertraline, and Sulfa antibiotics    Review of Systems  All other systems reviewed and are negative.   Updated Vital Signs BP (!) 201/99 (BP Location: Right Arm)   Pulse 67   Temp 98.2 F (36.8 C) (Oral)   Resp 18   SpO2 100%   Physical Exam Vitals and nursing note reviewed.  Constitutional:      General: She is not in acute distress.    Appearance: Normal appearance. She is well-developed.  HENT:     Head: Normocephalic and atraumatic.  Eyes:     Conjunctiva/sclera: Conjunctivae normal.     Pupils: Pupils are equal, round, and reactive to light.  Cardiovascular:     Rate and Rhythm: Normal rate and regular rhythm.     Heart sounds: Normal heart sounds.  Pulmonary:     Effort: Pulmonary effort is normal. No respiratory distress.     Breath sounds: Normal breath sounds.  Abdominal:     General: There is no distension.     Palpations: Abdomen is soft.     Tenderness: There is no abdominal tenderness.  Musculoskeletal:        General: No deformity. Normal range of motion.     Cervical back: Normal range of motion and neck supple.  Skin:    General: Skin is warm and dry.  Neurological:     General: No focal deficit present.     Mental Status: She is alert and oriented to person, place, and time.     (all labs ordered are listed, but only abnormal results are displayed) Labs Reviewed  CBC - Abnormal; Notable for the following components:      Result Value   Platelets 144 (*)    All other components within normal limits  COMPREHENSIVE METABOLIC PANEL WITH GFR - Abnormal; Notable for the following components:   Glucose, Bld 119 (*)    Total Protein 6.2 (*)    Alkaline Phosphatase 23 (*)    GFR, Estimated 57 (*)    All other components within normal limits  TROPONIN I (HIGH SENSITIVITY) - Abnormal; Notable for the following components:   Troponin I (High Sensitivity) 123 (*)    All other components within normal limits   TROPONIN I (HIGH SENSITIVITY) - Abnormal; Notable for the following components:   Troponin I (High Sensitivity) 134 (*)    All other components within normal limits    EKG: None  Radiology: DG Chest 2 View Result Date: 05/26/2024 EXAM: 2 VIEW(S) XRAY OF THE CHEST 05/26/2024 01:02:00 PM COMPARISON: 01/21/2024 CLINICAL HISTORY: chest pain. Per triage notes: Pt c/p htn x 2 days; endorsing HA and lethargy; reports systolic 180s-190s; endorses compliance with meds; Hx of GERD, HTN; Nonsmoker FINDINGS: LUNGS AND PLEURA: Low lung volumes. No focal pulmonary opacity. No pulmonary edema. No pleural effusion. No pneumothorax. HEART AND MEDIASTINUM: Extensive aortic atherosclerosis (ICD10-I70.0). No acute abnormality of the  cardiac and mediastinal silhouettes. BONES AND SOFT TISSUES: Reverse right shoulder arthroplasty in expected location. Severe thoracolumbar scoliosis. Cholecystectomy clips noted. IMPRESSION: 1. No acute cardiopulmonary process. 2. Low lung volumes. 3. Severe thoracolumbar scoliosis. 4. Extensive aortic atherosclerosis (ICD10-I70.0). Electronically signed by: Ryan Chess MD 05/26/2024 01:16 PM EDT RP Workstation: [id number]   CT Head Wo Contrast Result Date: 05/26/2024 EXAM: CT HEAD WITHOUT CONTRAST 05/26/2024 01:07:08 PM TECHNIQUE: CT of the head was performed without the administration of intravenous contrast. Automated exposure control, iterative reconstruction, and/or weight based adjustment of the mA/kV was utilized to reduce the radiation dose to as low as reasonably achievable. COMPARISON: Head CT 07/01/2023. CLINICAL HISTORY: Headache, new onset (Age >= 51y). Chief complaints; HTN. FINDINGS: BRAIN AND VENTRICLES: Partially calcified extra-axial mass along the right frontal operculum measuring up to 22 x 10 mm on axial image 32 series 2, unchanged and most consistent with a meningioma. Stable background of moderate chronic small vessel disease. No acute hemorrhage. No evidence of  acute infarct. No hydrocephalus. No acute extra-axial collection. No mass effect or midline shift. ORBITS: No acute abnormality. SINUSES: No acute abnormality. SOFT TISSUES AND SKULL: No acute soft tissue abnormality. No skull fracture. IMPRESSION: 1. No acute intracranial abnormality. 2. Stable partially calcified extra-axial mass along the right frontal operculum, most consistent with a meningioma. Electronically signed by: Ryan Chess MD 05/26/2024 01:15 PM EDT RP Workstation: [id number]     Procedures   Medications Ordered in the ED  nitroGLYCERIN 50 mg in dextrose  5 % 250 mL (0.2 mg/mL) infusion (5 mcg/min Intravenous New Bag/Given 05/26/24 1733)  amLODipine (NORVASC) tablet 5 mg (has no administration in time range)  carvedilol (COREG) tablet 12.5 mg (has no administration in time range)  acetaminophen  (TYLENOL ) tablet 1,000 mg (1,000 mg Oral Given 05/26/24 1729)  aspirin chewable tablet 324 mg (324 mg Oral Given 05/26/24 1729)                                    Medical Decision Making Patient is presenting with significantly elevated blood pressures with a systolic in the 200 range.  Patient reports associated headache, transient chest pain, nausea.  Patient is found to have notable elevation in troponins - 123, and then 134.  EKG is not suggestive of significant acute ischemia.  Other obtained screening labs are on the whole without significant abnormality.  Patient with benefit from admission for control of her elevated blood pressure.  Nitroglycerin drip initiated for BP control.  Cardiology is aware of case and will consult given elevated troponins.  Patient without any active chest pain during my evaluation time.  Hospitalist service made aware of case and need for admission.  Risk OTC drugs. Prescription drug management. Decision regarding hospitalization.   CRITICAL CARE Performed by: Maude JAYSON Galloway   Total critical care time: 30 minutes  Critical care time  was exclusive of separately billable procedures and treating other patients.  Critical care was necessary to treat or prevent imminent or life-threatening deterioration.  Critical care was time spent personally by me on the following activities: development of treatment plan with patient and/or surrogate as well as nursing, discussions with consultants, evaluation of patient's response to treatment, examination of patient, obtaining history from patient or surrogate, ordering and performing treatments and interventions, ordering and review of laboratory studies, ordering and review of radiographic studies, pulse oximetry and re-evaluation of patient's condition.  Final diagnoses:  Hypertension, unspecified type  Elevated troponin    ED Discharge Orders     None          Laurice Maude BROCKS, MD 05/26/24 1759

## 2024-05-26 NOTE — Consult Note (Signed)
 Cardiology Consultation   Patient ID: Erica Day MRN: [id number]; DOB: Dec 18, 1940  Admit date: 05/26/2024 Date of Consult: 05/26/2024  PCP:  [facility]   [facility] Providers Cardiologist:  None      Patient Profile: Erica Day is a 83 y.o. female with a history of paroxysmal atrial fibrillation, hypertension, hyperlipidemia, GERD, antiphospholipid syndrome, rheumatoid arthritis, Sjogren syndrome, and diffuse large B-cell lymphoma who is being seen 05/26/2024 for the evaluation of chest pain and elevated troponin at the request of Dr. Laurice.  History of Present Illness: Erica Day is a 83 year old female with the above history.  She presents with feeling poor for the past 2 to 3 days which have coincided with severely elevated blood pressure.  She has been seen by her primary care physician and noted blood pressure to be quite elevated.  She presents to the ER today with complaints of hypertension, chest pain and headache. She chest pressure with severely elevated blood pressure last night.  She also endorses headache.  Describes squeezing sensation.  No fevers or chills.  No cough or congestion.  She has chronic dyspnea on exertion which appears to be stable.  On arrival to [facility], BP as high as 201/99.  EKG shows normal sinus rhythm, rate 71 bpm, with LVH and what looks like early repolarization but no acute ischemic changes compared to prior tracing in 06/2023.  High sensitive troponin mildly elevated and flat at 123 >> 134. WBC 5.6, Hgb 14.5, Plts 144. Na 144, K 4.7, Glucose 119, BUN 22, Cr 0.97. She was started on on a nitroglycerin drip.  Blood pressure around 170 at the time my interview.  She follows with cardiology at [facility]. She has a history of chest pain and shortness of breath. Last Echo in 06/2020 showed LVEF of 65-70%, normal RV function, and no significant valvular disease.   Myoview in 12/2022 was  low risk with a fixed moderately sized perfusion defect in the basal inferolateral wall but no ischemia.  It does not look like she ever had any definitive evaluation of her coronaries.  He was last seen by Cardiology in 05/05/2024 at which time she continued to report intermittent chest pressure while at rest as well as occasional shortness of breath.  Her Imdur  was increased.  She was offered referral to Pulmonology but patient declined.   Past Medical History:  Diagnosis Date   Arthritis    C. difficile colitis 01/2018   Depression    GERD (gastroesophageal reflux disease)    Hypertension    Lymphoma (HCC)    Sjogren's syndrome    UTI (urinary tract infection) 01/2018    Past Surgical History:  Procedure Laterality Date   BREAST REDUCTION SURGERY     CHOLECYSTECTOMY     FOOT SURGERY     ORIF FEMUR FRACTURE Left 07/02/2023   Procedure: OPEN REDUCTION INTERNAL FIXATION (ORIF) DISTAL FEMUR FRACTURE;  Surgeon: Kendal Franky SQUIBB, MD;  Location: [facility] OR;  Service: Orthopedics;  Laterality: Left;   REPLACEMENT TOTAL KNEE BILATERAL     SHOULDER SURGERY     TONSILLECTOMY     TOTAL ABDOMINAL HYSTERECTOMY       Home Medications:  Prior to Admission medications   Medication Sig Start Date End Date Taking? Authorizing Provider  acetaminophen  (TYLENOL ) 500 MG tablet Take 1 tablet (500 mg total) by mouth every 6 (six) hours as needed for mild pain (pain score 1-3) or moderate pain (pain score 4-6).  07/06/23   Hongalgi, Anand D, MD  apixaban  (ELIQUIS ) 2.5 MG TABS tablet Take 1 tablet (2.5 mg total) by mouth 2 (two) times daily. 07/05/23 08/04/23  Danton Lauraine LABOR, PA-C  docusate sodium  (COLACE) 100 MG capsule Take 100 mg by mouth as needed for mild constipation.    [provider]  folic acid  (FOLVITE ) 1 MG tablet Take 1 mg by mouth daily. 04/20/17   [provider]  gabapentin  (NEURONTIN ) 400 MG capsule Take 400 mg by mouth at bedtime. 12/02/17   [provider]   hydroxychloroquine  (PLAQUENIL ) 200 MG tablet Take 200 mg by mouth daily.    [provider]  isosorbide  mononitrate (IMDUR ) 30 MG 24 hr tablet Take 1 tablet by mouth daily. 05/12/23 05/11/24  [provider]  lidocaine-prilocaine (EMLA) cream Apply 1 application topically as needed (port).     [provider]  magnesium  oxide (MAG-OX) 400 MG tablet Take 400 mg by mouth daily. 04/10/19   [provider]  metoprolol  tartrate (LOPRESSOR ) 50 MG tablet Take 50 mg by mouth 2 (two) times daily. 12/08/17   [provider]  mirabegron  ER (MYRBETRIQ ) 50 MG TB24 tablet Take 50 mg by mouth at bedtime.    [provider]  Multiple Vitamin (MULTIVITAMIN) capsule Take 1 capsule by mouth daily.    [provider]  nystatin (MYCOSTATIN/NYSTOP) powder Apply 1 application topically 2 (two) times daily as needed (under breast and back).     [provider]  ondansetron  (ZOFRAN ) 8 MG tablet Take 8 mg by mouth every 8 (eight) hours as needed for nausea or vomiting.  02/07/18   [provider]  pantoprazole  (PROTONIX ) 40 MG tablet Take 1 tablet by mouth daily. 05/29/20   [provider]  potassium chloride SA (K-DUR,KLOR-CON) 20 MEQ tablet Take 20 mEq by mouth every other day.    [provider]  ranitidine (ZANTAC) 150 MG tablet Take 150 mg by mouth 2 (two) times daily. 12/01/17   [provider]  simvastatin  (ZOCOR ) 20 MG tablet Take 20 mg by mouth at bedtime. 12/08/17   [provider]  temazepam  (RESTORIL ) 30 MG capsule Take 1 capsule (30 mg total) by mouth at bedtime. 07/06/23 02/03/29  Hongalgi, Anand D, MD  timolol  (BETIMOL ) 0.5 % ophthalmic solution Place 1 drop into both eyes daily.    [provider]  tiZANidine  (ZANAFLEX ) 2 MG tablet Take 1 tablet (2 mg total) by mouth every 8 (eight) hours as needed for muscle spasms. 07/05/23   Danton Lauraine LABOR, PA-C  traMADol  (ULTRAM ) 50 MG tablet Take 1  tablet (50 mg total) by mouth every 6 (six) hours as needed for severe pain (pain score 7-10). 07/05/23   Danton Lauraine LABOR, PA-C  Travoprost, BAK Free, (TRAVATAN) 0.004 % SOLN ophthalmic solution Place 1 drop into both eyes at bedtime.    [provider]    Scheduled Meds:  acetaminophen   1,000 mg Oral Once   aspirin  324 mg Oral Once   Continuous Infusions:  nitroGLYCERIN     PRN Meds:   Allergies:    Allergies  Allergen Reactions   Benylin Cough [Diphenhydramine] Anaphylaxis and Shortness Of Breath   Codeine Nausea And Vomiting    Tolerates tramadol    Tape Other (See Comments)    Pt states makes her skin burn  Transparent dressing causes a rash per patient - Please use mepilex   Aloe Vera Rash    Turn to sores   Penicillins Rash  Sertraline Anxiety   Sulfa Antibiotics Rash    Social History:   Social History   Socioeconomic History   Marital status: Widowed    Spouse name: Not on file   Number of children: Not on file   Years of education: Not on file   Highest education level: Not on file  Occupational History   Occupation: retired  Tobacco Use   Smoking status: Never   Smokeless tobacco: Never  Vaping Use   Vaping status: Never Used  Substance and Sexual Activity   Alcohol use: Never   Drug use: Never   Sexual activity: Not on file  Other Topics Concern   Not on file  Social History Narrative   Not on file   Social Drivers of Health   Financial Resource Strain: Not on file  Food Insecurity: Low Risk  (01/21/2024)   Received from [facility]   Hunger Vital Sign    Within the past 12 months, you worried that your food would run out before you got money to buy more: Never true    Within the past 12 months, the food you bought just didn't last and you didn't have money to get more. : Never true  Transportation Needs: No Transportation Needs (01/21/2024)   Received from [facility]    In the past 12 months, has lack of  reliable transportation kept you from medical appointments, meetings, work or from getting things needed for daily living? : No  Physical Activity: Not on file  Stress: Not on file  Social Connections: Unknown (12/30/2021)   Received from [facility]   Social Network    Social Network: Not on file  Intimate Partner Violence: Not At Risk (07/02/2023)   Humiliation, Afraid, Rape, and Kick questionnaire    Fear of Current or Ex-Partner: No    Emotionally Abused: No    Physically Abused: No    Sexually Abused: No    Family History:   History reviewed. No pertinent family history.   ROS:  Please see the history of present illness.   All other ROS reviewed and negative.     Physical Exam/Data: Vitals:   05/26/24 1042 05/26/24 1508  BP: (!) 173/71 (!) 201/99  Pulse: 64 67  Resp: 18 18  Temp: 98.1 F (36.7 C) 98.2 F (36.8 C)  TempSrc:  Oral  SpO2: 99% 100%   No intake or output data in the 24 hours ending 05/26/24 1711    07/01/2023    5:45 PM 02/07/2018    8:50 AM  Last 3 Weights  Weight (lbs) 130 lb 1.1 oz 130 lb  Weight (kg) 59 kg 58.968 kg     There is no height or weight on file to calculate BMI.  General:  Well nourished, well developed, in no acute distress HEENT: normal Neck: no JVD Vascular: No carotid bruits; Distal pulses 2+ bilaterally Cardiac:  normal S1, S2; RRR; no murmur  Lungs:  clear to auscultation bilaterally, no wheezing, rhonchi or rales  Abd: soft, nontender, no hepatomegaly  Ext: no edema Musculoskeletal:  No deformities, BUE and BLE strength normal and equal Skin: warm and dry  Neuro:  CNs 2-12 intact, no focal abnormalities noted Psych:  Normal affect   EKG:  The EKG was personally reviewed and demonstrates: Sinus rhythm 71, RSR prime pattern, no acute ischemic changes Telemetry:  Telemetry was personally reviewed and demonstrates: Sinus rhythm 70s  Relevant CV Studies: NM SPECT Stress 12/16/2022 This result has  an attachment that is not  available.    ECG test result was negative for evidence of ischemia. There was no  chest pain during the exam.    Myocardial perfusion imaging is abnormal. 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    The SPECT images demonstrate a fixed, moderately sized perfusion  abnormality of moderate intensity located in the basal inferolateral wall.    Overall left ventricular systolic function was mildly reduced. The  calculated ejection fraction was measured at 54%.    Low cardiovascular risk   Laboratory Data: High Sensitivity Troponin:   Recent Labs  Lab 05/26/24 1244 05/26/24 1443  TROPONINIHS 123* 134*     Chemistry Recent Labs  Lab 05/26/24 1244  NA 144  K 4.7  CL 108  CO2 27  GLUCOSE 119*  BUN 22  CREATININE 0.97  CALCIUM 9.6  GFRNONAA 57*  ANIONGAP 9    Recent Labs  Lab 05/26/24 1244  PROT 6.2*  ALBUMIN 3.8  AST 36  ALT 27  ALKPHOS 23*  BILITOT 0.8   Lipids No results for input(s): CHOL, TRIG, HDL, LABVLDL, LDLCALC, CHOLHDL in the last 168 hours.  Hematology Recent Labs  Lab 05/26/24 1244  WBC 5.6  RBC 4.51  HGB 14.5  HCT 43.7  MCV 96.9  MCH 32.2  MCHC 33.2  RDW 12.9  PLT 144*   Thyroid No results for input(s): TSH, FREET4 in the last 168 hours.  BNPNo results for input(s): BNP, PROBNP in the last 168 hours.  DDimer No results for input(s): DDIMER in the last 168 hours.  Radiology/Studies:  DG Chest 2 View Result Date: 05/26/2024 EXAM: 2 VIEW(S) XRAY OF THE CHEST 05/26/2024 01:02:00 PM COMPARISON: 01/21/2024 CLINICAL HISTORY: chest pain. Per triage notes: Pt c/p htn x 2 days; endorsing HA and lethargy; reports systolic 180s-190s; endorses compliance with meds; Hx of GERD, HTN; Nonsmoker FINDINGS: LUNGS AND PLEURA: Low lung volumes. No focal pulmonary opacity. No pulmonary edema. No pleural effusion. No pneumothorax. HEART AND MEDIASTINUM: Extensive aortic atherosclerosis (ICD10-I70.0). No acute abnormality of  the cardiac and mediastinal silhouettes. BONES AND SOFT TISSUES: Reverse right shoulder arthroplasty in expected location. Severe thoracolumbar scoliosis. Cholecystectomy clips noted. IMPRESSION: 1. No acute cardiopulmonary process. 2. Low lung volumes. 3. Severe thoracolumbar scoliosis. 4. Extensive aortic atherosclerosis (ICD10-I70.0). Electronically signed by: Ryan Chess MD 05/26/2024 01:16 PM EDT RP Workstation: [id number]   CT Head Wo Contrast Result Date: 05/26/2024 EXAM: CT HEAD WITHOUT CONTRAST 05/26/2024 01:07:08 PM TECHNIQUE: CT of the head was performed without the administration of intravenous contrast. Automated exposure control, iterative reconstruction, and/or weight based adjustment of the mA/kV was utilized to reduce the radiation dose to as low as reasonably achievable. COMPARISON: Head CT 07/01/2023. CLINICAL HISTORY: Headache, new onset (Age >= 51y). Chief complaints; HTN. FINDINGS: BRAIN AND VENTRICLES: Partially calcified extra-axial mass along the right frontal operculum measuring up to 22 x 10 mm on axial image 32 series 2, unchanged and most consistent with a meningioma. Stable background of moderate chronic small vessel disease. No acute hemorrhage. No evidence of acute infarct. No hydrocephalus. No acute extra-axial collection. No mass effect or midline shift. ORBITS: No acute abnormality. SINUSES: No acute abnormality. SOFT TISSUES AND SKULL: No acute soft tissue abnormality. No skull fracture. IMPRESSION: 1. No acute intracranial abnormality. 2. Stable partially calcified extra-axial mass along the right frontal operculum, most consistent with a meningioma. Electronically signed by: Ryan Chess MD 05/26/2024 01:15 PM EDT RP Workstation: [id number]  Assessment and Plan:  # HTN Crisis # Demand Ischemia - She presents with severely elevated blood pressure.  She has headache and minimally elevated troponins.  Troponins are actually flat.  EKG is nonischemic.  CV exam is  unremarkable. - She also describes headache. - Overall, I believe all of her symptoms are related to hypertensive crisis.  She seems to be much improved on the nitroglycerin drip.  Would recommend to continue this.  We are going to start amlodipine 5 mg daily and carvedilol 12.5 mg twice daily.  Would recommend to gradually slow her blood pressure over the next 24 hours. - Her troponin elevation is likely demand in the setting of small vessel disease.  Her chest symptoms have improved.  I suspect this will continue to improve with treatment of her blood pressure. - Okay to hold aspirin.  She is on Eliquis  for paroxysmal A-fib.  She does not need to be on heparin.  Okay to continue home statin. - We can also add back her home Imdur  depending on how her blood pressure responds. - We will also order an echocardiogram.  I think this can be treated medically unless she has significant wall motion abnormality.  To me all of her issues are related to blood pressure.  # Paroxysmal A-fib -In sinus rhythm.  No recurrence.  Continue Eliquis  2.5 mg twice daily.  Changing her metoprolol  to carvedilol for better blood pressure control.  For questions or updates, please contact [facility] Please consult [email or web address] for contact info under   Signed, Darryle T. Barbaraann, MD [facility]  [facility]  05/26/2024 6:09 PM

## 2024-05-26 NOTE — ED Triage Notes (Addendum)
 Pt c/p htn x 2 days; endorsing HA and lethargy; reports systolic 180s-190s; endorses compliance with meds; pt gives verbal consent for mse

## 2024-05-26 NOTE — ED Notes (Signed)
 Pt ambulatory to bathroom with cane and nurse assist

## 2024-05-26 NOTE — Progress Notes (Signed)
   Update to consult note from earlier this evening. Patient has A-fib listed under past medical history in notes in Care Everywhere at [facility]. However, patient and daughter deny this. She states she has never been told she has atrial fibrillation. She has Eliquis  listed under PTA medications but this was prescribed after an orthopedic surgery not for atrial fibrillation. Can continue to monitor on telemetry while here.  Kayton Ripp E Fadil Macmaster, PA-C 05/26/2024 8:40 PM

## 2024-05-26 NOTE — ED Provider Triage Note (Signed)
 Emergency Medicine Provider Triage Evaluation Note  Erica Day , a 83 y.o. female  was evaluated in triage.  Pt complains of hypertension, headache, chest pain.  Reports that for the past 2 days her blood pressure has been high despite compliance with her antihypertensive regimen.  Reports she also took an extra half dose of one of her medications without any improvement in her blood pressure.  Reports her pressures are normally well-controlled.  States last night she started to have some pressure in the middle of her chest.  Also reports a headache that is on the top of her head.  Endorses nausea without vomiting.  Denies vision changes.  Review of Systems  Positive:  Negative:   Physical Exam  BP (!) 173/71 (BP Location: Left Arm)   Pulse 64   Temp 98.1 F (36.7 C)   Resp 18   SpO2 99%  Gen:   Awake, no distress   Resp:  Normal effort  MSK:   Moves extremities without difficulty  Other:    Medical Decision Making  Medically screening exam initiated at 12:42 PM.  Appropriate orders placed.  Erica Day was informed that the remainder of the evaluation will be completed by another provider, this initial triage assessment does not replace that evaluation, and the importance of remaining in the ED until their evaluation is complete.     Nora Lauraine LABOR, PA-C 05/26/24 1244

## 2024-05-27 ENCOUNTER — Inpatient Hospital Stay (HOSPITAL_COMMUNITY)

## 2024-05-27 ENCOUNTER — Other Ambulatory Visit (HOSPITAL_COMMUNITY): Payer: Self-pay

## 2024-05-27 DIAGNOSIS — I169 Hypertensive crisis, unspecified: Secondary | ICD-10-CM

## 2024-05-27 DIAGNOSIS — R7989 Other specified abnormal findings of blood chemistry: Secondary | ICD-10-CM | POA: Diagnosis not present

## 2024-05-27 DIAGNOSIS — I160 Hypertensive urgency: Secondary | ICD-10-CM

## 2024-05-27 LAB — TROPONIN I (HIGH SENSITIVITY)
Troponin I (High Sensitivity): 115 ng/L (ref <18)
Troponin I (High Sensitivity): 146 ng/L (ref <18)

## 2024-05-27 LAB — ECHOCARDIOGRAM COMPLETE
AR max vel: 3.09 cm2
AV Peak grad: 5.1 mmHg
Ao pk vel: 1.13 m/s
Area-P 1/2: 3.77 cm2
S' Lateral: 1.95 cm

## 2024-05-27 MED ORDER — HYDROXYCHLOROQUINE SULFATE 200 MG PO TABS
200.0000 mg | ORAL_TABLET | Freq: Every day | ORAL | Status: DC
  Filled 2024-05-27: qty 1

## 2024-05-27 MED ORDER — AMLODIPINE BESYLATE 10 MG PO TABS
10.0000 mg | ORAL_TABLET | Freq: Every day | ORAL | 0 refills | Status: AC
  Filled 2024-05-27: qty 30, 30d supply, fill #0

## 2024-05-27 MED ORDER — TIMOLOL MALEATE 0.5 % OP SOLN
1.0000 [drp] | Freq: Every day | OPHTHALMIC | Status: DC
  Filled 2024-05-27: qty 5

## 2024-05-27 MED ORDER — SIMVASTATIN 20 MG PO TABS: 20.0000 mg | ORAL_TABLET | Freq: Every day | ORAL | Status: DC

## 2024-05-27 MED ORDER — SENNOSIDES-DOCUSATE SODIUM 8.6-50 MG PO TABS: 1.0000 | ORAL_TABLET | Freq: Every evening | ORAL | Status: DC | PRN

## 2024-05-27 MED ORDER — HYDRALAZINE HCL 20 MG/ML IJ SOLN: 10.0000 mg | INTRAMUSCULAR | Status: DC | PRN

## 2024-05-27 MED ORDER — PANTOPRAZOLE SODIUM 40 MG PO TBEC
40.0000 mg | DELAYED_RELEASE_TABLET | Freq: Every day | ORAL | Status: DC
  Administered 2024-05-27: 40 mg via ORAL
  Filled 2024-05-27: qty 1

## 2024-05-27 MED ORDER — GLUCAGON HCL RDNA (DIAGNOSTIC) 1 MG IJ SOLR: 1.0000 mg | INTRAMUSCULAR | Status: DC | PRN

## 2024-05-27 MED ORDER — APIXABAN 2.5 MG PO TABS: 2.5000 mg | ORAL_TABLET | Freq: Two times a day (BID) | ORAL | Status: DC

## 2024-05-27 MED ORDER — IPRATROPIUM-ALBUTEROL 0.5-2.5 (3) MG/3ML IN SOLN: 3.0000 mL | RESPIRATORY_TRACT | Status: DC | PRN

## 2024-05-27 MED ORDER — AMLODIPINE BESYLATE 5 MG PO TABS
10.0000 mg | ORAL_TABLET | Freq: Every day | ORAL | Status: DC
  Administered 2024-05-27: 10 mg via ORAL
  Filled 2024-05-27: qty 2

## 2024-05-27 MED ORDER — ACETAMINOPHEN 325 MG PO TABS: 650.0000 mg | ORAL_TABLET | Freq: Four times a day (QID) | ORAL | Status: DC | PRN

## 2024-05-27 MED ORDER — GABAPENTIN 400 MG PO CAPS: 400.0000 mg | ORAL_CAPSULE | Freq: Every day | ORAL | Status: DC

## 2024-05-27 MED ORDER — CARVEDILOL 12.5 MG PO TABS
12.5000 mg | ORAL_TABLET | Freq: Two times a day (BID) | ORAL | 0 refills | Status: AC
  Filled 2024-05-27: qty 60, 30d supply, fill #0

## 2024-05-27 MED ORDER — APIXABAN 2.5 MG PO TABS
2.5000 mg | ORAL_TABLET | Freq: Two times a day (BID) | ORAL | 0 refills | Status: AC
  Filled 2024-05-27: qty 60, 30d supply, fill #0

## 2024-05-27 MED ORDER — ISOSORBIDE MONONITRATE ER 30 MG PO TB24: 30.0000 mg | ORAL_TABLET | Freq: Every day | ORAL | Status: DC

## 2024-05-27 MED ORDER — ONDANSETRON HCL 4 MG/2ML IJ SOLN: 4.0000 mg | Freq: Four times a day (QID) | INTRAMUSCULAR | Status: DC | PRN

## 2024-05-27 MED ORDER — TIMOLOL MALEATE 0.5 % OP SOLN: 1.0000 [drp] | Freq: Every day | OPHTHALMIC | Status: DC

## 2024-05-27 MED ORDER — MIRABEGRON ER 50 MG PO TB24
50.0000 mg | ORAL_TABLET | Freq: Every day | ORAL | Status: DC
  Filled 2024-05-27: qty 1

## 2024-05-27 MED ORDER — METOPROLOL TARTRATE 5 MG/5ML IV SOLN: 5.0000 mg | INTRAVENOUS | Status: DC | PRN

## 2024-05-27 MED ORDER — TEMAZEPAM 30 MG PO CAPS: 30.0000 mg | ORAL_CAPSULE | Freq: Every day | ORAL | Status: DC

## 2024-05-27 NOTE — Hospital Course (Addendum)
 Brief Narrative:   83 year old with history of paroxysmal A-fib, HTN, HLD, rheumatoid arthritis, Sjogren, lymphoma presents to the ED with complaints of chest pain.  Upon admission found to have hypertensive urgency.  CT of the head showed calcified stable mass, chest x-ray negative.  Started on nitroglycerin drip and cardiology team was consulted.  Echocardiogram performed shows preserved ef.  Stable for DC today. Cleared by Cardiology.  Daughter updated by me and answered all of her questions.   Assessment & Plan:  Hypertensive urgency Essential hypertension Elevated troponin, demand ischemia - Initial blood pressure greater than 200 systolic.  Not improving.  On nitroglycerin drip, slowly transitioning to home p.o. medication including Coreg, Imdur  and amlodipine.  We can uptitrate medications as necessary.  Paroxysmal A-fib - Coreg and Eliquis   Chronic pain - Gabapentin   Rheumatoid arthritis - Plaquenil   Glaucoma - Eyedrops  Hyperlipidemia - Statin   DVT prophylaxis: Eliquis     Code Status: Prior Family Communication:   Status is: Inpatient Remains inpatient appropriate because: Continue hospital stay, echocardiogram read is pending   Subjective: Denies shortness of breath.   Examination:  General exam: Appears calm and comfortable  Respiratory system: Clear to auscultation. Respiratory effort normal. Cardiovascular system: S1 & S2 heard, RRR. No JVD, murmurs, rubs, gallops or clicks. No pedal edema. Gastrointestinal system: Abdomen is nondistended, soft and nontender. No organomegaly or masses felt. Normal bowel sounds heard. Central nervous system: Alert and oriented. No focal neurological deficits. Extremities: Symmetric 5 x 5 power. Skin: No rashes, lesions or ulcers Psychiatry: Judgement and insight appear normal. Mood & affect appropriate.

## 2024-05-27 NOTE — Care Management Obs Status (Signed)
 MEDICARE OBSERVATION STATUS NOTIFICATION   Patient Details  Name: Erica Day MRN: [id number] Date of Birth: February 04, 1941   Medicare Observation Status Notification Given:  Yes    Corean JAYSON Canary, RN 05/27/2024, 12:11 PM

## 2024-05-27 NOTE — Progress Notes (Signed)
   Echocardiogram showed EF 65-70% with no regional wall motion abnormalities, grade 1 DD, normal RV systolic function, mild AI.  Patient follows up with [facility].  Instructed patient to follow-up with her cardiologist within the next 4 weeks.  Continue amlodipine 10 mg daily, Eliquis  2.5 mg twice daily, carvedilol 12.5 mg twice daily, Imdur  30 mg daily.  Rollo FABIENE Louder, PA-C 05/27/2024 11:19 AM

## 2024-05-27 NOTE — ED Notes (Signed)
Patient transported to ECHO.

## 2024-05-27 NOTE — H&P (Signed)
 History and Physical    Erica Day FMW:[id number] DOB: Apr 05, 1941 DOA: 05/26/2024  PCP: [facility]   Chief Complaint: cp  HPI: Erica Day is a 83 y.o. female with medical history significant of paroxysmal A-fib, hypertension, hyperlipidemia, rheumatoid arthritis, Sjogren, lymphoma who presents emergency department due to chest pain.  Patient is felt sick over the last several days.  She saw her primary care doctor who noted her to have elevated blood sugars and was referred to the ER for further assessment.  On arrival she was hypertensive with systolics in the 200s.  Labs were obtained which showed WBC 5.6, hemoglobin 14.5, troponin 123, 134.  Creatinine 0.97.  CT head was obtained which showed stable calcified mass and no other acute findings.  Chest x-ray showed no acute findings.  Patient was started on nitroglycerin drip.  Cardiology was consulted for further assessment.  Review of Systems: Review of Systems  Constitutional: Negative.   HENT: Negative.    Eyes: Negative.   Respiratory: Negative.    Cardiovascular: Negative.   Gastrointestinal: Negative.   Genitourinary: Negative.   Musculoskeletal: Negative.   Skin: Negative.   Neurological: Negative.   Endo/Heme/Allergies: Negative.   Psychiatric/Behavioral: Negative.       As per HPI otherwise 10 point review of systems negative.   Allergies  Allergen Reactions   Benylin Cough [Diphenhydramine] Anaphylaxis and Shortness Of Breath   Codeine Nausea And Vomiting    Tolerates tramadol    Tape Other (See Comments)    Pt states makes her skin burn  Transparent dressing causes a rash per patient - Please use mepilex   Aloe Vera Rash    Turn to sores   Penicillins Rash   Sertraline Anxiety   Sulfa Antibiotics Rash    Past Medical History:  Diagnosis Date   Arthritis    C. difficile colitis 01/2018   Depression    GERD (gastroesophageal reflux disease)    Hypertension    Lymphoma (HCC)     Sjogren's syndrome    UTI (urinary tract infection) 01/2018    Past Surgical History:  Procedure Laterality Date   BREAST REDUCTION SURGERY     CHOLECYSTECTOMY     FOOT SURGERY     ORIF FEMUR FRACTURE Left 07/02/2023   Procedure: OPEN REDUCTION INTERNAL FIXATION (ORIF) DISTAL FEMUR FRACTURE;  Surgeon: Kendal Franky SQUIBB, MD;  Location: [facility] OR;  Service: Orthopedics;  Laterality: Left;   REPLACEMENT TOTAL KNEE BILATERAL     SHOULDER SURGERY     TONSILLECTOMY     TOTAL ABDOMINAL HYSTERECTOMY       reports that she has never smoked. She has never used smokeless tobacco. She reports that she does not drink alcohol and does not use drugs.  History reviewed. No pertinent family history.  Prior to Admission medications   Medication Sig Start Date End Date Taking? Authorizing Provider  acetaminophen  (TYLENOL ) 500 MG tablet Take 1 tablet (500 mg total) by mouth every 6 (six) hours as needed for mild pain (pain score 1-3) or moderate pain (pain score 4-6). 07/06/23  Yes Hongalgi, Anand D, MD  gabapentin  (NEURONTIN ) 400 MG capsule Take 400 mg by mouth at bedtime. 12/02/17  Yes [provider]  hydroxychloroquine  (PLAQUENIL ) 200 MG tablet Take 200 mg by mouth daily.   Yes [provider]  isosorbide  mononitrate (IMDUR ) 30 MG 24 hr tablet Take 1 tablet by mouth daily. 05/12/23 05/26/25 Yes [provider]  mirabegron  ER (MYRBETRIQ ) 50 MG TB24 tablet Take  50 mg by mouth at bedtime.   Yes [provider]  pantoprazole  (PROTONIX ) 40 MG tablet Take 1 tablet by mouth daily. 05/29/20  Yes [provider]  temazepam  (RESTORIL ) 30 MG capsule Take 1 capsule (30 mg total) by mouth at bedtime. 07/06/23 02/03/29 Yes Hongalgi, Anand D, MD  traMADol  (ULTRAM ) 50 MG tablet Take 1 tablet (50 mg total) by mouth every 6 (six) hours as needed for severe pain (pain score 7-10). 07/05/23  Yes Danton Lauraine LABOR, PA-C  apixaban  (ELIQUIS ) 2.5 MG TABS tablet Take 1 tablet (2.5 mg  total) by mouth 2 (two) times daily. 07/05/23 08/04/23  Danton Lauraine LABOR, PA-C  docusate sodium  (COLACE) 100 MG capsule Take 100 mg by mouth as needed for mild constipation.    [provider]  folic acid  (FOLVITE ) 1 MG tablet Take 1 mg by mouth daily. 04/20/17   [provider]  lidocaine-prilocaine (EMLA) cream Apply 1 application topically as needed (port).     [provider]  magnesium  oxide (MAG-OX) 400 MG tablet Take 400 mg by mouth daily. 04/10/19   [provider]  metoprolol  tartrate (LOPRESSOR ) 50 MG tablet Take 50 mg by mouth 2 (two) times daily. 12/08/17   [provider]  Multiple Vitamin (MULTIVITAMIN) capsule Take 1 capsule by mouth daily.    [provider]  nystatin (MYCOSTATIN/NYSTOP) powder Apply 1 application topically 2 (two) times daily as needed (under breast and back).     [provider]  ondansetron  (ZOFRAN ) 8 MG tablet Take 8 mg by mouth every 8 (eight) hours as needed for nausea or vomiting.  02/07/18   [provider]  potassium chloride SA (K-DUR,KLOR-CON) 20 MEQ tablet Take 20 mEq by mouth every other day.    [provider]  ranitidine (ZANTAC) 150 MG tablet Take 150 mg by mouth 2 (two) times daily. 12/01/17   [provider]  simvastatin  (ZOCOR ) 20 MG tablet Take 20 mg by mouth at bedtime. 12/08/17   [provider]  timolol  (BETIMOL ) 0.5 % ophthalmic solution Place 1 drop into both eyes daily.    [provider]  tiZANidine  (ZANAFLEX ) 2 MG tablet Take 1 tablet (2 mg total) by mouth every 8 (eight) hours as needed for muscle spasms. 07/05/23   Danton Lauraine LABOR, PA-C  Travoprost, BAK Free, (TRAVATAN) 0.004 % SOLN ophthalmic solution Place 1 drop into both eyes at bedtime.    [provider]    Physical Exam: Vitals:   05/27/24 0035 05/27/24 0100 05/27/24 0130 05/27/24 0200  BP: (!) 151/85 (!) 152/95 (!) 148/77 (!) 151/86  Pulse: 73 74 71 72  Resp: 17 (!)  21 20 (!) 21  Temp:      TempSrc:      SpO2: 99% 99% 97% 99%   Physical Exam Constitutional:      Appearance: She is normal weight.  HENT:     Head: Normocephalic.     Mouth/Throat:     Mouth: Mucous membranes are moist.     Pharynx: Oropharynx is clear.  Eyes:     Conjunctiva/sclera: Conjunctivae normal.     Pupils: Pupils are equal, round, and reactive to light.  Cardiovascular:     Rate and Rhythm: Normal rate and regular rhythm.     Pulses: Normal pulses.     Heart sounds: Normal heart sounds.  Pulmonary:     Effort: Pulmonary effort is normal.     Breath sounds: Normal breath sounds.  Abdominal:  General: Abdomen is flat. Bowel sounds are normal.  Musculoskeletal:        General: Normal range of motion.     Cervical back: Normal range of motion.  Skin:    General: Skin is warm.     Capillary Refill: Capillary refill takes less than 2 seconds.  Neurological:     General: No focal deficit present.     Mental Status: She is alert.  Psychiatric:        Mood and Affect: Mood normal.      Labs on Admission: I have personally reviewed the patients's labs and imaging studies.  Assessment/Plan Principal Problem:   Hypertensive urgency   # Hypertensive emergency # Elevated troponin - Patient been having headache and symptomatic from elevated blood pressure - Blood pressures greater than 200 on presentation - Troponin elevation in setting of elevated blood pressures likely related to demand - Cardiology consulted and started on nitroglycerin drip  Plan: Discontinue nitroglycerin drip due to headache Continue amlodipine and carvedilol Trend troponin Appreciate cardiology recommendations  # Paroxysmal A-fib-continue Eliquis   # Chronic pain-continue gabapentin   # RA-continue Plaquenil   # Urinary tension-continue Restoril , Myrbetriq 's  # Glaucoma-continue eyedrops  # Hyperlipidemia-continue simvastatin    Admission status: Inpatient Telemetry  Cardiac  Certification: The appropriate patient status for this patient is INPATIENT. Inpatient status is judged to be reasonable and necessary in order to provide the required intensity of service to ensure the patient's safety. The patient's presenting symptoms, physical exam findings, and initial radiographic and laboratory data in the context of their chronic comorbidities is felt to place them at high risk for further clinical deterioration. Furthermore, it is not anticipated that the patient will be medically stable for discharge from the hospital within 2 midnights of admission.   * I certify that at the point of admission it is my clinical judgment that the patient will require inpatient hospital care spanning beyond 2 midnights from the point of admission due to high intensity of service, high risk for further deterioration and high frequency of surveillance required.DEWAINE Lamar Dess MD [facility] If 7PM-7AM, please contact night-coverage [email or web address]  05/27/2024, 3:45 AM

## 2024-05-27 NOTE — ED Notes (Signed)
 Pt requesting Tylenol  for headache. Dr. Dena notified

## 2024-05-27 NOTE — Care Management CC44 (Addendum)
 Condition Code 44 Documentation Completed  Patient Details  Name: Erica Day MRN: [id number] Date of Birth: 1941-08-07   Condition Code 44 given:  Yes Patient signature on Condition Code 44 notice:  Yes Documentation of 2 MD's agreement:  Yes Code 44 added to claim:  Yes Code 44 explained via interpreter service. The patient is going between [place] and [place]. She complained about not getting medications last night. Unsure if she really understands notice. Called daughter and left message to return call. Will look for her at bedside to explain to her also.    Corean JAYSON Canary, RN 05/27/2024, 12:11 PM

## 2024-05-27 NOTE — ED Notes (Signed)
 Pt ambulated to restroom x1 assist with cane & tol well.

## 2024-05-27 NOTE — Progress Notes (Signed)
 Echocardiogram 2D Echocardiogram has been performed.  [name] 05/27/2024, 9:50 AM

## 2024-05-27 NOTE — Progress Notes (Signed)
 PROGRESS NOTE    Erica Day  FMW:[id number] DOB: [birth year] DOA: 05/26/2024 PCP: [facility]    Brief Narrative:   83 year old with history of paroxysmal A-fib, HTN, HLD, rheumatoid arthritis, Sjogren, lymphoma presents to the ED with complaints of chest pain.  Upon admission found to have hypertensive urgency.  CT of the head showed calcified stable mass, chest x-ray negative.  Started on nitroglycerin drip and cardiology team was consulted.  Echocardiogram performed, read is currently pending.  Assessment & Plan:  Hypertensive urgency Essential hypertension Elevated troponin, demand ischemia - Initial blood pressure greater than 200 systolic.  Not improving.  On nitroglycerin drip, slowly transitioning to home p.o. medication including Coreg, Imdur  and amlodipine.  We can uptitrate medications as necessary.  Paroxysmal A-fib - Coreg and Eliquis   Chronic pain - Gabapentin   Rheumatoid arthritis - Plaquenil   Glaucoma - Eyedrops  Hyperlipidemia - Statin   DVT prophylaxis: apixaban  (ELIQUIS ) tablet 2.5 mg Start: 05/27/24 1000 apixaban  (ELIQUIS ) tablet 2.5 mg      Code Status: Prior Family Communication:   Status is: Inpatient Remains inpatient appropriate because: Continue hospital stay, echocardiogram read is pending   Subjective: Denies shortness of breath.   Examination:  General exam: Appears calm and comfortable  Respiratory system: Clear to auscultation. Respiratory effort normal. Cardiovascular system: S1 & S2 heard, RRR. No JVD, murmurs, rubs, gallops or clicks. No pedal edema. Gastrointestinal system: Abdomen is nondistended, soft and nontender. No organomegaly or masses felt. Normal bowel sounds heard. Central nervous system: Alert and oriented. No focal neurological deficits. Extremities: Symmetric 5 x 5 power. Skin: No rashes, lesions or ulcers Psychiatry: Judgement and insight appear normal. Mood & affect appropriate.                   Objective: Vitals:   05/27/24 0500 05/27/24 0746 05/27/24 0747 05/27/24 0800  BP: (!) 156/84 (!) 147/84 (!) 147/84 (!) 142/74  Pulse: 73 82 77 78  Resp: 16  (!) 23 20  Temp:   97.6 F (36.4 C)   TempSrc:   Oral   SpO2: 97%  97% 98%    Intake/Output Summary (Last 24 hours) at 05/27/2024 1054 Last data filed at 05/27/2024 0355 Gross per 24 hour  Intake 38.58 ml  Output --  Net 38.58 ml   There were no vitals filed for this visit.  Scheduled Meds:  amLODipine  10 mg Oral Daily   apixaban   2.5 mg Oral BID   carvedilol  12.5 mg Oral BID WC   gabapentin   400 mg Oral QHS   hydroxychloroquine   200 mg Oral Daily   isosorbide  mononitrate  30 mg Oral Daily   mirabegron  ER  50 mg Oral QHS   pantoprazole   40 mg Oral Daily   simvastatin   20 mg Oral QHS   temazepam   30 mg Oral QHS   timolol   1 drop Both Eyes Daily   Continuous Infusions:  Nutritional status     There is no height or weight on file to calculate BMI.  Data Reviewed:   CBC: Recent Labs  Lab 05/26/24 1244  WBC 5.6  HGB 14.5  HCT 43.7  MCV 96.9  PLT 144*   Basic Metabolic Panel: Recent Labs  Lab 05/26/24 1244  NA 144  K 4.7  CL 108  CO2 27  GLUCOSE 119*  BUN 22  CREATININE 0.97  CALCIUM 9.6   GFR: CrCl cannot be calculated (Unknown ideal weight.). Liver Function Tests: Recent Labs  Lab 05/26/24 1244  AST 36  ALT 27  ALKPHOS 23*  BILITOT 0.8  PROT 6.2*  ALBUMIN 3.8   No results for input(s): LIPASE, AMYLASE in the last 168 hours. No results for input(s): AMMONIA in the last 168 hours. Coagulation Profile: No results for input(s): INR, PROTIME in the last 168 hours. Cardiac Enzymes: No results for input(s): CKTOTAL, CKMB, CKMBINDEX, TROPONINI in the last 168 hours. BNP (last 3 results) No results for input(s): PROBNP in the last 8760 hours. HbA1C: No results for input(s): HGBA1C in the last 72 hours. CBG: No results for input(s):  GLUCAP in the last 168 hours. Lipid Profile: No results for input(s): CHOL, HDL, LDLCALC, TRIG, CHOLHDL, LDLDIRECT in the last 72 hours. Thyroid Function Tests: No results for input(s): TSH, T4TOTAL, FREET4, T3FREE, THYROIDAB in the last 72 hours. Anemia Panel: No results for input(s): VITAMINB12, FOLATE, FERRITIN, TIBC, IRON, RETICCTPCT in the last 72 hours. Sepsis Labs: No results for input(s): PROCALCITON, LATICACIDVEN in the last 168 hours.  No results found for this or any previous visit (from the past 240 hours).       Radiology Studies: DG Chest 2 View Result Date: 05/26/2024 EXAM: 2 VIEW(S) XRAY OF THE CHEST 05/26/2024 01:02:00 PM COMPARISON: 01/21/2024 CLINICAL HISTORY: chest pain. Per triage notes: Pt c/p htn x 2 days; endorsing HA and lethargy; reports systolic 180s-190s; endorses compliance with meds; Hx of GERD, HTN; Nonsmoker FINDINGS: LUNGS AND PLEURA: Low lung volumes. No focal pulmonary opacity. No pulmonary edema. No pleural effusion. No pneumothorax. HEART AND MEDIASTINUM: Extensive aortic atherosclerosis (ICD10-I70.0). No acute abnormality of the cardiac and mediastinal silhouettes. BONES AND SOFT TISSUES: Reverse right shoulder arthroplasty in expected location. Severe thoracolumbar scoliosis. Cholecystectomy clips noted. IMPRESSION: 1. No acute cardiopulmonary process. 2. Low lung volumes. 3. Severe thoracolumbar scoliosis. 4. Extensive aortic atherosclerosis (ICD10-I70.0). Electronically signed by: Ryan Chess MD 05/26/2024 01:16 PM EDT RP Workstation: [id number]   CT Head Wo Contrast Result Date: 05/26/2024 EXAM: CT HEAD WITHOUT CONTRAST 05/26/2024 01:07:08 PM TECHNIQUE: CT of the head was performed without the administration of intravenous contrast. Automated exposure control, iterative reconstruction, and/or weight based adjustment of the mA/kV was utilized to reduce the radiation dose to as low as reasonably achievable.  COMPARISON: Head CT 07/01/2023. CLINICAL HISTORY: Headache, new onset (Age >= 51y). Chief complaints; HTN. FINDINGS: BRAIN AND VENTRICLES: Partially calcified extra-axial mass along the right frontal operculum measuring up to 22 x 10 mm on axial image 32 series 2, unchanged and most consistent with a meningioma. Stable background of moderate chronic small vessel disease. No acute hemorrhage. No evidence of acute infarct. No hydrocephalus. No acute extra-axial collection. No mass effect or midline shift. ORBITS: No acute abnormality. SINUSES: No acute abnormality. SOFT TISSUES AND SKULL: No acute soft tissue abnormality. No skull fracture. IMPRESSION: 1. No acute intracranial abnormality. 2. Stable partially calcified extra-axial mass along the right frontal operculum, most consistent with a meningioma. Electronically signed by: Ryan Chess MD 05/26/2024 01:15 PM EDT RP Workstation: [id number]           LOS: 1 day   Time spent= 35 mins    Burgess JAYSON Dare, MD [facility]  If 7PM-7AM, please contact night-coverage  05/27/2024, 10:54 AM

## 2024-05-27 NOTE — Discharge Summary (Signed)
 Physician Discharge Summary  Erica Day FMW:[id number] DOB: [birth year] DOA: 05/26/2024  PCP: [facility]  Admit date: 05/26/2024 Discharge date: 05/27/2024  Admitted From: Home Disposition:  Home  Recommendations for Outpatient Follow-up:  Follow up with PCP in 1-2 weeks Please obtain BMP/CBC in one week your next doctors visit.  Norvasc and Coreg. Discontinue metoprolol  Cont Imdur .    Discharge Condition: Stable CODE STATUS: Full Diet recommendation: Low Na  Brief/Interim Summary: Brief Narrative:   83 year old with history of paroxysmal A-fib, HTN, HLD, rheumatoid arthritis, Sjogren, lymphoma presents to the ED with complaints of chest pain.  Upon admission found to have hypertensive urgency.  CT of the head showed calcified stable mass, chest x-ray negative.  Started on nitroglycerin drip and cardiology team was consulted.  Echocardiogram performed shows preserved ef.  Stable for DC today. Cleared by Cardiology.  Daughter updated by me and answered all of her questions.   Assessment & Plan:  Hypertensive urgency Essential hypertension Elevated troponin, demand ischemia - Initial blood pressure greater than 200 systolic.  Not improving.  On nitroglycerin drip, slowly transitioning to home p.o. medication including Coreg, Imdur  and amlodipine.  We can uptitrate medications as necessary.  Paroxysmal A-fib - Coreg and Eliquis   Chronic pain - Gabapentin   Rheumatoid arthritis - Plaquenil   Glaucoma - Eyedrops  Hyperlipidemia - Statin   DVT prophylaxis: Eliquis     Code Status: Prior Family Communication:   Status is: Inpatient Remains inpatient appropriate because: Continue hospital stay, echocardiogram read is pending   Subjective: Denies shortness of breath.   Examination:  General exam: Appears calm and comfortable  Respiratory system: Clear to auscultation. Respiratory effort normal. Cardiovascular system: S1 & S2 heard,  RRR. No JVD, murmurs, rubs, gallops or clicks. No pedal edema. Gastrointestinal system: Abdomen is nondistended, soft and nontender. No organomegaly or masses felt. Normal bowel sounds heard. Central nervous system: Alert and oriented. No focal neurological deficits. Extremities: Symmetric 5 x 5 power. Skin: No rashes, lesions or ulcers Psychiatry: Judgement and insight appear normal. Mood & affect appropriate.    Discharge Diagnoses:  Principal Problem:   Hypertensive urgency      Discharge Exam: Vitals:   05/27/24 0800 05/27/24 1100  BP: (!) 142/74 (!) 142/115  Pulse: 78 83  Resp: 20 (!) 22  Temp:    SpO2: 98% 100%   Vitals:   05/27/24 0746 05/27/24 0747 05/27/24 0800 05/27/24 1100  BP: (!) 147/84 (!) 147/84 (!) 142/74 (!) 142/115  Pulse: 82 77 78 83  Resp:  (!) 23 20 (!) 22  Temp:  97.6 F (36.4 C)    TempSrc:  Oral    SpO2:  97% 98% 100%      Discharge Instructions   Allergies as of 05/27/2024       Reactions   Benylin Cough [diphenhydramine] Anaphylaxis, Shortness Of Breath   Codeine Nausea And Vomiting   Tolerates tramadol    Tape Other (See Comments)   Pt states makes her skin burn  Transparent dressing causes a rash per patient - Please use mepilex   Aloe Vera Rash   Turn to sores   Penicillins Rash   Sertraline Anxiety   Sulfa Antibiotics Rash        Medication List     STOP taking these medications    metoprolol  tartrate 50 MG tablet Commonly known as: LOPRESSOR        TAKE these medications    acetaminophen  500 MG tablet Commonly known as: TYLENOL  Take 1 tablet (  500 mg total) by mouth every 6 (six) hours as needed for mild pain (pain score 1-3) or moderate pain (pain score 4-6).   amLODipine 10 MG tablet Commonly known as: NORVASC Tome 1 tableta (10 mg en total) por va oral diariamente. (Take 1 tablet (10 mg total) by mouth daily.) Start taking on: May 28, 2024   apixaban  2.5 MG Tabs tablet Commonly known as:  Eliquis  Take 1 tablet (2.5 mg total) by mouth 2 (two) times daily.   B-12 PO Take 1 tablet by mouth daily.   carvedilol 12.5 MG tablet Commonly known as: COREG Take 1 tablet (12.5 mg total) by mouth 2 (two) times daily with a meal.   D3 PO Take 1 tablet by mouth daily.   docusate sodium  100 MG capsule Commonly known as: COLACE Take 100 mg by mouth daily as needed for mild constipation.   folic acid  1 MG tablet Commonly known as: FOLVITE  Take 1 mg by mouth daily.   gabapentin  400 MG capsule Commonly known as: NEURONTIN  Take 400 mg by mouth at bedtime.   hydroxychloroquine  200 MG tablet Commonly known as: PLAQUENIL  Take 200 mg by mouth daily.   isosorbide  mononitrate 30 MG 24 hr tablet Commonly known as: IMDUR  Take 30 mg by mouth daily.   latanoprost  0.005 % ophthalmic solution Commonly known as: XALATAN  Place 1 drop into both eyes at bedtime.   lidocaine-prilocaine cream Commonly known as: EMLA Apply 1 application topically as needed (port).   magnesium  oxide 400 MG tablet Commonly known as: MAG-OX Take 400 mg by mouth daily.   meloxicam 7.5 MG tablet Commonly known as: MOBIC Take 7.5 mg by mouth daily as needed for pain.   mirabegron  ER 50 MG Tb24 tablet Commonly known as: MYRBETRIQ  Take 50 mg by mouth at bedtime.   MULTIVITAMIN PO Take 1 tablet by mouth daily.   nystatin powder Commonly known as: MYCOSTATIN/NYSTOP Apply 1 application topically 2 (two) times daily as needed (under breast and back).   ondansetron  8 MG tablet Commonly known as: ZOFRAN  Take 8 mg by mouth every 8 (eight) hours as needed for nausea or vomiting.   pantoprazole  40 MG tablet Commonly known as: PROTONIX  Take 40 mg by mouth at bedtime.   PROBIOTIC PO Take 1 Dose by mouth daily.   simvastatin  20 MG tablet Commonly known as: ZOCOR  Take 20 mg by mouth at bedtime.   temazepam  30 MG capsule Commonly known as: RESTORIL  Take 1 capsule (30 mg total) by mouth at bedtime.    timolol  0.5 % ophthalmic solution Commonly known as: TIMOPTIC  Place 1 drop into both eyes every morning.   tiZANidine  2 MG tablet Commonly known as: ZANAFLEX  Take 1 tablet (2 mg total) by mouth every 8 (eight) hours as needed for muscle spasms.   traMADol  50 MG tablet Commonly known as: ULTRAM  Take 1 tablet (50 mg total) by mouth every 6 (six) hours as needed for severe pain (pain score 7-10). What changed: when to take this        Allergies  Allergen Reactions   Benylin Cough [Diphenhydramine] Anaphylaxis and Shortness Of Breath   Codeine Nausea And Vomiting    Tolerates tramadol    Tape Other (See Comments)    Pt states makes her skin burn  Transparent dressing causes a rash per patient - Please use mepilex   Aloe Vera Rash    Turn to sores   Penicillins Rash   Sertraline Anxiety   Sulfa Antibiotics Rash    You  were cared for by a hospitalist during your hospital stay. If you have any questions about your discharge medications or the care you received while you were in the hospital after you are discharged, you can call the unit and asked to speak with the hospitalist on call if the hospitalist that took care of you is not available. Once you are discharged, your primary care physician will handle any further medical issues. Please note that no refills for any discharge medications will be authorized once you are discharged, as it is imperative that you return to your primary care physician (or establish a relationship with a primary care physician if you do not have one) for your aftercare needs so that they can reassess your need for medications and monitor your lab values.  You were cared for by a hospitalist during your hospital stay. If you have any questions about your discharge medications or the care you received while you were in the hospital after you are discharged, you can call the unit and asked to speak with the hospitalist on call if the hospitalist that took care  of you is not available. Once you are discharged, your primary care physician will handle any further medical issues. Please note that NO REFILLS for any discharge medications will be authorized once you are discharged, as it is imperative that you return to your primary care physician (or establish a relationship with a primary care physician if you do not have one) for your aftercare needs so that they can reassess your need for medications and monitor your lab values.  Please request your Prim.MD to go over all Hospital Tests and Procedure/Radiological results at the follow up, please get all Hospital records sent to your Prim MD by signing hospital release before you go home.  Get CBC, CMP, 2 view Chest X ray checked  by Primary MD during your next visit or SNF MD in 5-7 days ( we routinely change or add medications that can affect your baseline labs and fluid status, therefore we recommend that you get the mentioned basic workup next visit with your PCP, your PCP may decide not to get them or add new tests based on their clinical decision)  On your next visit with your primary care physician please Get Medicines reviewed and adjusted.  If you experience worsening of your admission symptoms, develop shortness of breath, life threatening emergency, suicidal or homicidal thoughts you must seek medical attention immediately by calling 911 or calling your MD immediately  if symptoms less severe.  You Must read complete instructions/literature along with all the possible adverse reactions/side effects for all the Medicines you take and that have been prescribed to you. Take any new Medicines after you have completely understood and accpet all the possible adverse reactions/side effects.   Do not drive, operate heavy machinery, perform activities at heights, swimming or participation in water activities or provide baby sitting services if your were admitted for syncope or siezures until you have seen by  Primary MD or a Neurologist and advised to do so again.  Do not drive when taking Pain medications.   Procedures/Studies: ECHOCARDIOGRAM COMPLETE Result Date: 05/27/2024    ECHOCARDIOGRAM REPORT   Patient Name:   Erica Day Date of Exam: 05/27/2024 Medical Rec #:  [id number]     Height:       67.0 in Accession #:    [id number]    Weight:       130.1 lb Date of Birth:  1941-02-17  BSA:          1.684 m Patient Age:    83 years      BP:           142/74 mmHg Patient Gender: F             HR:           80 bpm. Exam Location:  Inpatient Procedure: 2D Echo, Cardiac Doppler and Color Doppler (Both Spectral and Color            Flow Doppler were utilized during procedure). Indications:    Elevated Troponin  History:        Patient has prior history of Echocardiogram examinations, most                 recent 07/03/2023. Risk Factors:Hypertension.  Sonographer:    [name] RCS Referring Phys: CALLIE E GOODRICH IMPRESSIONS  1. Left ventricular ejection fraction, by estimation, is 65 to 70%. The left ventricle has normal function. The left ventricle has no regional wall motion abnormalities. Left ventricular diastolic parameters are consistent with Grade I diastolic dysfunction (impaired relaxation). Elevated left atrial pressure.  2. Right ventricular systolic function is normal. The right ventricular size is normal. Tricuspid regurgitation signal is inadequate for assessing PA pressure.  3. Left atrial size was mildly dilated.  4. The mitral valve is degenerative. Trivial mitral valve regurgitation. Moderate mitral annular calcification.  5. The aortic valve is tricuspid. Aortic valve regurgitation is mild. No aortic stenosis is present.  6. The inferior vena cava is normal in size with greater than 50% respiratory variability, suggesting right atrial pressure of 3 mmHg. Comparison(s): Prior images reviewed side by side. LVEF 65-70% with mild diastolic dysfunction. FINDINGS  Left Ventricle: Left  ventricular ejection fraction, by estimation, is 65 to 70%. The left ventricle has normal function. The left ventricle has no regional wall motion abnormalities. The left ventricular internal cavity size was normal in size. There is  no left ventricular hypertrophy. Left ventricular diastolic parameters are consistent with Grade I diastolic dysfunction (impaired relaxation). Elevated left atrial pressure. Right Ventricle: The right ventricular size is normal. No increase in right ventricular wall thickness. Right ventricular systolic function is normal. Tricuspid regurgitation signal is inadequate for assessing PA pressure. Left Atrium: Left atrial size was mildly dilated. Right Atrium: Right atrial size was normal in size. Pericardium: There is no evidence of pericardial effusion. Mitral Valve: The mitral valve is degenerative in appearance. Moderate mitral annular calcification. Trivial mitral valve regurgitation. Tricuspid Valve: The tricuspid valve is grossly normal. Tricuspid valve regurgitation is trivial. Aortic Valve: The aortic valve is tricuspid. There is mild aortic valve annular calcification. Aortic valve regurgitation is mild. No aortic stenosis is present. Aortic valve peak gradient measures 5.1 mmHg. Pulmonic Valve: The pulmonic valve was grossly normal. Pulmonic valve regurgitation is trivial. Aorta: The aortic root and ascending aorta are structurally normal, with no evidence of dilitation. Venous: The inferior vena cava is normal in size with greater than 50% respiratory variability, suggesting right atrial pressure of 3 mmHg. IAS/Shunts: No atrial level shunt detected by color flow Doppler. Additional Comments: 3D was performed not requiring image post processing on an independent workstation and was indeterminate.  LEFT VENTRICLE PLAX 2D LVIDd:         2.80 cm   Diastology LVIDs:         1.95 cm   LV e' medial:    3.70 cm/s LV PW:  1.00 cm   LV E/e' medial:  19.2 LV IVS:        0.90 cm    LV e' lateral:   3.92 cm/s LVOT diam:     2.00 cm   LV E/e' lateral: 18.1 LV SV:         59 LV SV Index:   35 LVOT Area:     3.14 cm LV IVRT:       102 msec  RIGHT VENTRICLE             IVC RV S prime:     13.80 cm/s  IVC diam: 1.20 cm TAPSE (M-mode): 1.8 cm LEFT ATRIUM             Index        RIGHT ATRIUM           Index LA diam:        4.20 cm 2.49 cm/m   RA Area:     11.80 cm LA Vol (A2C):   32.1 ml 19.06 ml/m  RA Volume:   23.70 ml  14.07 ml/m LA Vol (A4C):   43.1 ml 25.59 ml/m LA Biplane Vol: 38.3 ml 22.74 ml/m  AORTIC VALVE AV Area (Vmax): 3.09 cm AV Vmax:        112.50 cm/s AV Peak Grad:   5.1 mmHg LVOT Vmax:      110.70 cm/s LVOT Vmean:     70.333 cm/s LVOT VTI:       0.189 m  AORTA Ao Root diam: 3.00 cm Ao Asc diam:  3.10 cm MITRAL VALVE MV Area (PHT): 3.77 cm     SHUNTS MV Decel Time: 201 msec     Systemic VTI:  0.19 m MV E velocity: 71.10 cm/s   Systemic Diam: 2.00 cm MV A velocity: 136.00 cm/s MV E/A ratio:  0.52 Jayson Sierras MD Electronically signed by Jayson Sierras MD Signature Date/Time: 05/27/2024/11:10:24 AM    Final    DG Chest 2 View Result Date: 05/26/2024 EXAM: 2 VIEW(S) XRAY OF THE CHEST 05/26/2024 01:02:00 PM COMPARISON: 01/21/2024 CLINICAL HISTORY: chest pain. Per triage notes: Pt c/p htn x 2 days; endorsing HA and lethargy; reports systolic 180s-190s; endorses compliance with meds; Hx of GERD, HTN; Nonsmoker FINDINGS: LUNGS AND PLEURA: Low lung volumes. No focal pulmonary opacity. No pulmonary edema. No pleural effusion. No pneumothorax. HEART AND MEDIASTINUM: Extensive aortic atherosclerosis (ICD10-I70.0). No acute abnormality of the cardiac and mediastinal silhouettes. BONES AND SOFT TISSUES: Reverse right shoulder arthroplasty in expected location. Severe thoracolumbar scoliosis. Cholecystectomy clips noted. IMPRESSION: 1. No acute cardiopulmonary process. 2. Low lung volumes. 3. Severe thoracolumbar scoliosis. 4. Extensive aortic atherosclerosis (ICD10-I70.0).  Electronically signed by: Ryan Chess MD 05/26/2024 01:16 PM EDT RP Workstation: [id number]   CT Head Wo Contrast Result Date: 05/26/2024 EXAM: CT HEAD WITHOUT CONTRAST 05/26/2024 01:07:08 PM TECHNIQUE: CT of the head was performed without the administration of intravenous contrast. Automated exposure control, iterative reconstruction, and/or weight based adjustment of the mA/kV was utilized to reduce the radiation dose to as low as reasonably achievable. COMPARISON: Head CT 07/01/2023. CLINICAL HISTORY: Headache, new onset (Age >= 51y). Chief complaints; HTN. FINDINGS: BRAIN AND VENTRICLES: Partially calcified extra-axial mass along the right frontal operculum measuring up to 22 x 10 mm on axial image 32 series 2, unchanged and most consistent with a meningioma. Stable background of moderate chronic small vessel disease. No acute hemorrhage. No evidence of acute infarct. No hydrocephalus. No acute extra-axial collection. No mass effect  or midline shift. ORBITS: No acute abnormality. SINUSES: No acute abnormality. SOFT TISSUES AND SKULL: No acute soft tissue abnormality. No skull fracture. IMPRESSION: 1. No acute intracranial abnormality. 2. Stable partially calcified extra-axial mass along the right frontal operculum, most consistent with a meningioma. Electronically signed by: Ryan Chess MD 05/26/2024 01:15 PM EDT RP Workstation: [id number]     The results of significant diagnostics from this hospitalization (including imaging, microbiology, ancillary and laboratory) are listed below for reference.     Microbiology: No results found for this or any previous visit (from the past 240 hours).   Labs: BNP (last 3 results) No results for input(s): BNP in the last 8760 hours. Basic Metabolic Panel: Recent Labs  Lab 05/26/24 1244  NA 144  K 4.7  CL 108  CO2 27  GLUCOSE 119*  BUN 22  CREATININE 0.97  CALCIUM 9.6   Liver Function Tests: Recent Labs  Lab 05/26/24 1244  AST 36  ALT  27  ALKPHOS 23*  BILITOT 0.8  PROT 6.2*  ALBUMIN 3.8   No results for input(s): LIPASE, AMYLASE in the last 168 hours. No results for input(s): AMMONIA in the last 168 hours. CBC: Recent Labs  Lab 05/26/24 1244  WBC 5.6  HGB 14.5  HCT 43.7  MCV 96.9  PLT 144*   Cardiac Enzymes: No results for input(s): CKTOTAL, CKMB, CKMBINDEX, TROPONINI in the last 168 hours. BNP: Invalid input(s): POCBNP CBG: No results for input(s): GLUCAP in the last 168 hours. D-Dimer No results for input(s): DDIMER in the last 72 hours. Hgb A1c No results for input(s): HGBA1C in the last 72 hours. Lipid Profile No results for input(s): CHOL, HDL, LDLCALC, TRIG, CHOLHDL, LDLDIRECT in the last 72 hours. Thyroid function studies No results for input(s): TSH, T4TOTAL, T3FREE, THYROIDAB in the last 72 hours.  Invalid input(s): FREET3 Anemia work up No results for input(s): VITAMINB12, FOLATE, FERRITIN, TIBC, IRON, RETICCTPCT in the last 72 hours. Urinalysis    Component Value Date/Time   COLORURINE AMBER (A) 02/07/2018 1001   APPEARANCEUR CLOUDY (A) 02/07/2018 1001   LABSPEC 1.016 02/07/2018 1001   PHURINE 5.0 02/07/2018 1001   GLUCOSEU NEGATIVE 02/07/2018 1001   HGBUR SMALL (A) 02/07/2018 1001   BILIRUBINUR NEGATIVE 02/07/2018 1001   KETONESUR NEGATIVE 02/07/2018 1001   PROTEINUR 30 (A) 02/07/2018 1001   NITRITE POSITIVE (A) 02/07/2018 1001   LEUKOCYTESUR TRACE (A) 02/07/2018 1001   Sepsis Labs Recent Labs  Lab 05/26/24 1244  WBC 5.6   Microbiology No results found for this or any previous visit (from the past 240 hours).   Time coordinating discharge:  I have spent 35 minutes face to face with the patient and on the ward discussing the patients care, assessment, plan and disposition with other care givers. >50% of the time was devoted counseling the patient about the risks and benefits of treatment/Discharge disposition and  coordinating care.   SIGNED:   Burgess JAYSON Dare, MD  [facility] 05/27/2024, 11:16 AM   If 7PM-7AM, please contact night-coverage

## 2024-05-27 NOTE — Progress Notes (Signed)
 Progress Note  Patient Name: Erica Day Date of Encounter: 05/27/2024  Primary Cardiologist:   None   Subjective   She denies pain or SOB.  She is anxious to go home.   Inpatient Medications    Scheduled Meds:  amLODipine  5 mg Oral Daily   apixaban   2.5 mg Oral BID   carvedilol  12.5 mg Oral BID WC   gabapentin   400 mg Oral QHS   hydroxychloroquine   200 mg Oral Daily   isosorbide  mononitrate  30 mg Oral Daily   mirabegron  ER  50 mg Oral QHS   pantoprazole   40 mg Oral Daily   simvastatin   20 mg Oral QHS   temazepam   30 mg Oral QHS   timolol   1 drop Both Eyes Daily   Continuous Infusions:  PRN Meds:    Vital Signs    Vitals:   05/27/24 0330 05/27/24 0401 05/27/24 0422 05/27/24 0500  BP: (!) 168/81  (!) 172/82 (!) 156/84  Pulse: 75  75 73  Resp: 15  20 16   Temp:  98 F (36.7 C)    TempSrc:  Oral    SpO2: 97%  99% 97%    Intake/Output Summary (Last 24 hours) at 05/27/2024 0731 Last data filed at 05/27/2024 0355 Gross per 24 hour  Intake 38.58 ml  Output --  Net 38.58 ml   There were no vitals filed for this visit.  Telemetry    NA - Personally Reviewed  ECG    NA - Personally Reviewed  Physical Exam   GEN: No acute distress.   Neck: No  JVD Cardiac: RRR, no murmurs, rubs, or gallops.  Respiratory: Clear  to auscultation bilaterally. GI: Soft, nontender, non-distended  MS: No  edema; No deformity. Neuro:  Nonfocal  Psych: Normal affect   Labs    Chemistry Recent Labs  Lab 05/26/24 1244  NA 144  K 4.7  CL 108  CO2 27  GLUCOSE 119*  BUN 22  CREATININE 0.97  CALCIUM 9.6  PROT 6.2*  ALBUMIN 3.8  AST 36  ALT 27  ALKPHOS 23*  BILITOT 0.8  GFRNONAA 57*  ANIONGAP 9     Hematology Recent Labs  Lab 05/26/24 1244  WBC 5.6  RBC 4.51  HGB 14.5  HCT 43.7  MCV 96.9  MCH 32.2  MCHC 33.2  RDW 12.9  PLT 144*    Cardiac EnzymesNo results for input(s): TROPONINI in the last 168 hours. No results for input(s):  TROPIPOC in the last 168 hours.   BNPNo results for input(s): BNP, PROBNP in the last 168 hours.   DDimer No results for input(s): DDIMER in the last 168 hours.   Radiology    DG Chest 2 View Result Date: 05/26/2024 EXAM: 2 VIEW(S) XRAY OF THE CHEST 05/26/2024 01:02:00 PM COMPARISON: 01/21/2024 CLINICAL HISTORY: chest pain. Per triage notes: Pt c/p htn x 2 days; endorsing HA and lethargy; reports systolic 180s-190s; endorses compliance with meds; Hx of GERD, HTN; Nonsmoker FINDINGS: LUNGS AND PLEURA: Low lung volumes. No focal pulmonary opacity. No pulmonary edema. No pleural effusion. No pneumothorax. HEART AND MEDIASTINUM: Extensive aortic atherosclerosis (ICD10-I70.0). No acute abnormality of the cardiac and mediastinal silhouettes. BONES AND SOFT TISSUES: Reverse right shoulder arthroplasty in expected location. Severe thoracolumbar scoliosis. Cholecystectomy clips noted. IMPRESSION: 1. No acute cardiopulmonary process. 2. Low lung volumes. 3. Severe thoracolumbar scoliosis. 4. Extensive aortic atherosclerosis (ICD10-I70.0). Electronically signed by: Ryan Chess MD 05/26/2024 01:16 PM EDT RP Workstation: [id number]  CT Head Wo Contrast Result Date: 05/26/2024 EXAM: CT HEAD WITHOUT CONTRAST 05/26/2024 01:07:08 PM TECHNIQUE: CT of the head was performed without the administration of intravenous contrast. Automated exposure control, iterative reconstruction, and/or weight based adjustment of the mA/kV was utilized to reduce the radiation dose to as low as reasonably achievable. COMPARISON: Head CT 07/01/2023. CLINICAL HISTORY: Headache, new onset (Age >= 51y). Chief complaints; HTN. FINDINGS: BRAIN AND VENTRICLES: Partially calcified extra-axial mass along the right frontal operculum measuring up to 22 x 10 mm on axial image 32 series 2, unchanged and most consistent with a meningioma. Stable background of moderate chronic small vessel disease. No acute hemorrhage. No evidence of acute  infarct. No hydrocephalus. No acute extra-axial collection. No mass effect or midline shift. ORBITS: No acute abnormality. SINUSES: No acute abnormality. SOFT TISSUES AND SKULL: No acute soft tissue abnormality. No skull fracture. IMPRESSION: 1. No acute intracranial abnormality. 2. Stable partially calcified extra-axial mass along the right frontal operculum, most consistent with a meningioma. Electronically signed by: Ryan Chess MD 05/26/2024 01:15 PM EDT RP Workstation: [id number]    Cardiac Studies   Echo is pending  Patient Profile     83 y.o. female with a history of paroxysmal atrial fibrillation, hypertension, hyperlipidemia, GERD, antiphospholipid syndrome, rheumatoid arthritis, Sjogren syndrome, and diffuse large B-cell lymphoma who is being seen 05/26/2024 for the evaluation of chest pain and elevated troponin at the request of Dr. Laurice.    Assessment & Plan     Hypertensive crisis:   Treated with NTG IV.  Started Norvasc and Coreg.   Increased Norvasc this morning.  Weaned NTG to off and use hydralazine PRN IV while we titrate PO meds.   Elevated trop:   Thought to be secondary to HTN.  No further work up.  Echo is pending.   DOAC:  The patient and family deny any history of atrial fib.  We would continue DOAC and have her follow with her primary cardiologist.     For questions or updates, please contact [facility] Please consult [email or web address] for contact info under Cardiology/STEMI.   Signed, Lynwood Schilling, MD  05/27/2024, 7:31 AM
